# Patient Record
Sex: Female | Born: 1950 | Race: White | Hispanic: No | Marital: Married | Smoking: Never smoker
Health system: Southern US, Community
[De-identification: ages and names within clinical notes are randomized; demographics above are authoritative.]

## PROBLEM LIST (undated history)

## (undated) DIAGNOSIS — T4145XA Adverse effect of unspecified anesthetic, initial encounter: Secondary | ICD-10-CM

## (undated) DIAGNOSIS — R519 Headache, unspecified: Secondary | ICD-10-CM

## (undated) DIAGNOSIS — I341 Nonrheumatic mitral (valve) prolapse: Secondary | ICD-10-CM

## (undated) DIAGNOSIS — R51 Headache: Secondary | ICD-10-CM

## (undated) DIAGNOSIS — T8859XA Other complications of anesthesia, initial encounter: Secondary | ICD-10-CM

## (undated) DIAGNOSIS — M1990 Unspecified osteoarthritis, unspecified site: Secondary | ICD-10-CM

## (undated) DIAGNOSIS — Z98811 Dental restoration status: Secondary | ICD-10-CM

## (undated) HISTORY — PX: APPENDECTOMY: SHX54

## (undated) HISTORY — PX: KNEE SURGERY: SHX244

---

## 2001-02-25 ENCOUNTER — Other Ambulatory Visit: Admission: RE | Admit: 2001-02-25 | Discharge: 2001-02-25 | Payer: Self-pay | Admitting: Family Medicine

## 2003-08-24 LAB — HM COLONOSCOPY: HM Colonoscopy: NORMAL

## 2004-06-28 DIAGNOSIS — E7800 Pure hypercholesterolemia, unspecified: Secondary | ICD-10-CM | POA: Insufficient documentation

## 2004-07-16 ENCOUNTER — Ambulatory Visit: Payer: Self-pay

## 2004-07-19 ENCOUNTER — Ambulatory Visit: Payer: Self-pay

## 2004-08-22 ENCOUNTER — Ambulatory Visit: Payer: Self-pay | Admitting: Specialist

## 2005-01-27 ENCOUNTER — Ambulatory Visit: Payer: Self-pay

## 2005-10-27 ENCOUNTER — Ambulatory Visit: Payer: Self-pay

## 2005-11-10 ENCOUNTER — Ambulatory Visit: Payer: Self-pay | Admitting: Family Medicine

## 2006-11-12 ENCOUNTER — Ambulatory Visit: Payer: Self-pay | Admitting: Family Medicine

## 2007-03-17 ENCOUNTER — Ambulatory Visit: Payer: Self-pay

## 2007-03-30 ENCOUNTER — Ambulatory Visit: Payer: Self-pay | Admitting: Family Medicine

## 2007-10-05 DIAGNOSIS — M899 Disorder of bone, unspecified: Secondary | ICD-10-CM | POA: Insufficient documentation

## 2007-11-18 ENCOUNTER — Ambulatory Visit: Payer: Self-pay

## 2008-11-20 ENCOUNTER — Ambulatory Visit: Payer: Self-pay

## 2009-09-10 ENCOUNTER — Ambulatory Visit: Payer: Self-pay | Admitting: Family Medicine

## 2009-12-14 ENCOUNTER — Ambulatory Visit: Payer: Self-pay

## 2011-01-14 LAB — HM PAP SMEAR: HM Pap smear: NEGATIVE

## 2011-01-15 ENCOUNTER — Ambulatory Visit: Payer: Self-pay | Admitting: Family Medicine

## 2012-02-10 ENCOUNTER — Ambulatory Visit: Payer: Self-pay | Admitting: Family Medicine

## 2013-03-01 ENCOUNTER — Ambulatory Visit: Payer: Self-pay | Admitting: Family Medicine

## 2013-03-09 ENCOUNTER — Ambulatory Visit: Payer: Self-pay | Admitting: Family Medicine

## 2014-11-02 LAB — LIPID PANEL
Cholesterol: 233 mg/dL — AB (ref 0–200)
HDL: 65 mg/dL (ref 35–70)
LDL Cholesterol: 151 mg/dL
TRIGLYCERIDES: 83 mg/dL (ref 40–160)

## 2014-11-02 LAB — HEPATIC FUNCTION PANEL
ALT: 24 U/L (ref 7–35)
AST: 23 U/L (ref 13–35)

## 2014-11-02 LAB — CBC AND DIFFERENTIAL
HEMATOCRIT: 39 % (ref 36–46)
Hemoglobin: 12.8 g/dL (ref 12.0–16.0)
PLATELETS: 281 10*3/uL (ref 150–399)
WBC: 5.2 10^3/mL

## 2014-11-02 LAB — BASIC METABOLIC PANEL
BUN: 13 mg/dL (ref 4–21)
CREATININE: 0.6 mg/dL (ref 0.5–1.1)
Glucose: 103 mg/dL
Potassium: 4.5 mmol/L (ref 3.4–5.3)
SODIUM: 140 mmol/L (ref 137–147)

## 2014-11-02 LAB — TSH: TSH: 2.49 u[IU]/mL (ref 0.41–5.90)

## 2014-11-10 ENCOUNTER — Ambulatory Visit
Admit: 2014-11-10 | Disposition: A | Discharge status: Home / Self Care | Payer: Self-pay | Attending: Family Medicine | Admitting: Family Medicine

## 2014-11-10 LAB — HM MAMMOGRAPHY

## 2015-01-16 ENCOUNTER — Telehealth: Payer: Self-pay

## 2015-01-16 NOTE — Telephone Encounter (Signed)
Mailed letter to pt to call and schedule colonoscopy.

## 2015-01-16 NOTE — Telephone Encounter (Signed)
-----   Message from Otilio Jefferson sent at 01/15/2015 10:51 AM EDT ----- Regarding: Re: Patient requesting call from nursing services Sent: 01/03/2015  no answer and there was no vm to leave a message ..md  > From: Carlota Raspberry > To: Colonoscopy, Triage > Sent: 01/02/2015 9:46 AM > COLON TRIAGE

## 2015-01-16 NOTE — Telephone Encounter (Signed)
Tried contacting pt. No vm to leave message.

## 2015-03-27 ENCOUNTER — Other Ambulatory Visit: Payer: Self-pay

## 2015-03-27 ENCOUNTER — Telehealth: Payer: Self-pay | Admitting: Gastroenterology

## 2015-03-27 NOTE — Telephone Encounter (Signed)
Gastroenterology Pre-Procedure Review  Request Date: 04-27-2015 Requesting Physician: Dr. Venia Minks   PATIENT REVIEW QUESTIONS: The patient responded to the following health history questions as indicated:    1. Are you having any GI issues? no 2. Do you have a personal history of Polyps? no 3. Do you have a family history of Colon Cancer or Polyps? no 4. Diabetes Mellitus? no 5. Joint replacements in the past 12 months?no 6. Major health problems in the past 3 months?no 7. Any artificial heart valves, MVP, or defibrillator?no    MEDICATIONS & ALLERGIES:    Patient reports the following regarding taking any anticoagulation/antiplatelet therapy:   Plavix, Coumadin, Eliquis, Xarelto, Lovenox, Pradaxa, Brilinta, or Effient? no Aspirin? no  Patient confirms/reports the following medications:  N/A No current outpatient prescriptions on file.   No current facility-administered medications for this visit.    Patient confirms/reports the following allergies: Codeine, makes her very sick  Allergies not on file  No orders of the defined types were placed in this encounter.    AUTHORIZATION INFORMATION Primary Insurance: 1D#: Group #:  Secondary Insurance: 1D#: Group #:  SCHEDULE INFORMATION: Date:04-27-2015  Time: [id number]

## 2015-03-27 NOTE — Telephone Encounter (Signed)
Order sent to [facility] and scheduled for 04-27-15.

## 2015-04-23 ENCOUNTER — Encounter: Payer: Self-pay | Admitting: *Deleted

## 2015-04-24 NOTE — Discharge Instructions (Signed)

## 2015-04-27 ENCOUNTER — Ambulatory Visit
Admission: RE | Admit: 2015-04-27 | Discharge: 2015-04-27 | Disposition: A | Discharge status: Home / Self Care | Payer: 59 | Source: Ambulatory Visit | Attending: Gastroenterology | Admitting: Gastroenterology

## 2015-04-27 ENCOUNTER — Encounter
Admission: RE | Disposition: A | Discharge status: Home / Self Care | Payer: Self-pay | Source: Ambulatory Visit | Attending: Gastroenterology

## 2015-04-27 ENCOUNTER — Ambulatory Visit: Payer: 59 | Admitting: Anesthesiology

## 2015-04-27 ENCOUNTER — Encounter: Payer: Self-pay | Admitting: Gastroenterology

## 2015-04-27 DIAGNOSIS — Z1211 Encounter for screening for malignant neoplasm of colon: Secondary | ICD-10-CM | POA: Diagnosis present

## 2015-04-27 DIAGNOSIS — I341 Nonrheumatic mitral (valve) prolapse: Secondary | ICD-10-CM | POA: Diagnosis not present

## 2015-04-27 DIAGNOSIS — Z7982 Long term (current) use of aspirin: Secondary | ICD-10-CM | POA: Insufficient documentation

## 2015-04-27 DIAGNOSIS — M170 Bilateral primary osteoarthritis of knee: Secondary | ICD-10-CM | POA: Diagnosis not present

## 2015-04-27 DIAGNOSIS — Z79899 Other long term (current) drug therapy: Secondary | ICD-10-CM | POA: Diagnosis not present

## 2015-04-27 DIAGNOSIS — K640 First degree hemorrhoids: Secondary | ICD-10-CM | POA: Diagnosis not present

## 2015-04-27 DIAGNOSIS — M160 Bilateral primary osteoarthritis of hip: Secondary | ICD-10-CM | POA: Insufficient documentation

## 2015-04-27 HISTORY — DX: Unspecified osteoarthritis, unspecified site: M19.90

## 2015-04-27 HISTORY — DX: Other complications of anesthesia, initial encounter: T88.59XA

## 2015-04-27 HISTORY — DX: Dental restoration status: Z98.811

## 2015-04-27 HISTORY — DX: Headache, unspecified: R51.9

## 2015-04-27 HISTORY — DX: Headache: R51

## 2015-04-27 HISTORY — PX: COLONOSCOPY WITH PROPOFOL: SHX5780

## 2015-04-27 HISTORY — DX: Nonrheumatic mitral (valve) prolapse: I34.1

## 2015-04-27 HISTORY — DX: Adverse effect of unspecified anesthetic, initial encounter: T41.45XA

## 2015-04-27 SURGERY — COLONOSCOPY WITH PROPOFOL
Anesthesia: Monitor Anesthesia Care | Wound class: Contaminated

## 2015-04-27 MED ORDER — OXYCODONE HCL 5 MG/5ML PO SOLN: 5.0000 mg | Freq: Once | ORAL | Status: DC | PRN

## 2015-04-27 MED ORDER — STERILE WATER FOR IRRIGATION IR SOLN
Status: DC | PRN
  Administered 2015-04-27: 5 mL

## 2015-04-27 MED ORDER — PROPOFOL 10 MG/ML IV BOLUS
INTRAVENOUS | Status: DC | PRN
  Administered 2015-04-27 (×6): 20 mg via INTRAVENOUS

## 2015-04-27 MED ORDER — LACTATED RINGERS IV SOLN
INTRAVENOUS | Status: DC
  Administered 2015-04-27: 09:00:00 via INTRAVENOUS

## 2015-04-27 MED ORDER — OXYCODONE HCL 5 MG PO TABS: 5.0000 mg | ORAL_TABLET | Freq: Once | ORAL | Status: DC | PRN

## 2015-04-27 MED ORDER — LIDOCAINE HCL (CARDIAC) 20 MG/ML IV SOLN
INTRAVENOUS | Status: DC | PRN
  Administered 2015-04-27: 30 mg via INTRAVENOUS

## 2015-04-27 SURGICAL SUPPLY — 28 items

## 2015-04-27 NOTE — Op Note (Signed)
[facility] Gastroenterology Patient Name: Lauren Sims Procedure Date: 04/27/2015 9:33 AM MRN: [id number] Account #: [id number] Date of Birth: 1950-12-06 Admit Type: Outpatient Age: 64 Room: [facility] OR ROOM 01 Gender: Female Note Status: Finalized Procedure:         Colonoscopy Indications:       Screening for colorectal malignant neoplasm Providers:         Lucilla Lame, MD Referring MD:      Jerrell Belfast, MD (Referring MD) Medicines:         Propofol per Anesthesia Complications:     No immediate complications. Procedure:         Pre-Anesthesia Assessment:                    - Prior to the procedure, a History and Physical was                     performed, and patient medications and allergies were                     reviewed. The patient's tolerance of previous anesthesia                     was also reviewed. The risks and benefits of the procedure                     and the sedation options and risks were discussed with the                     patient. All questions were answered, and informed consent                     was obtained. Prior Anticoagulants: The patient has taken                     no previous anticoagulant or antiplatelet agents. ASA                     Grade Assessment: II - A patient with mild systemic                     disease. After reviewing the risks and benefits, the                     patient was deemed in satisfactory condition to undergo                     the procedure.                    After obtaining informed consent, the colonoscope was                     passed under direct vision. Throughout the procedure, the                     patient's blood pressure, pulse, and oxygen saturations                     were monitored continuously. The was introduced through                     the anus and advanced to the the cecum, identified by  appendiceal orifice and ileocecal valve. The colonoscopy                was performed without difficulty. The patient tolerated                     the procedure well. The quality of the bowel preparation                     was excellent. Findings:      The perianal and digital rectal examinations were normal.      Non-bleeding internal hemorrhoids were found during retroflexion. The       hemorrhoids were Grade I (internal hemorrhoids that do not prolapse). Impression:        - Non-bleeding internal hemorrhoids.                    - No specimens collected. Recommendation:    - Repeat colonoscopy in 10 years for screening unless any                     change in family history or lower GI problems. Procedure Code(s): --- Professional ---                    [phone], Colonoscopy, flexible; diagnostic, including                     collection of specimen(s) by brushing or washing, when                     performed (separate procedure) Diagnosis Code(s): --- Professional ---                    Z12.11, Encounter for screening for malignant neoplasm of                     colon CPT copyright 2014 American Medical Association. All rights reserved. The codes documented in this report are preliminary and upon coder review may  be revised to meet current compliance requirements. Lucilla Lame, MD 04/27/2015 9:50:27 AM This report has been signed electronically. Number of Addenda: 0 Note Initiated On: 04/27/2015 9:33 AM Scope Withdrawal Time: 0 hours 6 minutes 40 seconds  Total Procedure Duration: 0 hours 11 minutes 47 seconds       [facility]

## 2015-04-27 NOTE — Transfer of Care (Signed)
Immediate Anesthesia Transfer of Care Note  Patient: Lauren Sims  Procedure(s) Performed: Procedure(s): COLONOSCOPY WITH PROPOFOL (N/A)  Patient Location: PACU  Anesthesia Type: MAC  Level of Consciousness: awake, alert  and patient cooperative  Airway and Oxygen Therapy: Patient Spontanous Breathing and Patient connected to supplemental oxygen  Post-op Assessment: Post-op Vital signs reviewed, Patient's Cardiovascular Status Stable, Respiratory Function Stable, Patent Airway and No signs of Nausea or vomiting  Post-op Vital Signs: Reviewed and stable  Complications: No apparent anesthesia complications

## 2015-04-27 NOTE — Anesthesia Procedure Notes (Signed)
Procedure Name: MAC Performed by: Nat Christen Pre-anesthesia Checklist: Patient identified, Emergency Drugs available, Suction available, Patient being monitored and Timeout performed Patient Re-evaluated:Patient Re-evaluated prior to inductionOxygen Delivery Method: Nasal cannula

## 2015-04-27 NOTE — H&P (Signed)
  [facility]  [address], [place], [place] [address] Phone: [phone] Fax : [phone]  Primary Care Physician:  Margarita Rana, MD Primary Gastroenterologist:  Dr. Allen Norris  Pre-Procedure History & Physical: HPI:  Lauren Sims is a 64 y.o. female is here for a screening colonoscopy.   Past Medical History  Diagnosis Date  . Complication of anesthesia     slow to wake  . Arthritis     hips/knees  . Headache   . Dental crown present     cap - top, front. crown - top,rear  . Mitral valve prolapse     followed by PCP    Past Surgical History  Procedure Laterality Date  . Knee surgery    . Appendectomy      Prior to Admission medications   Medication Sig Start Date End Date Taking? Authorizing Provider  aspirin 325 MG tablet Take 325 mg by mouth daily as needed.   Yes Historical Provider, MD  Cholecalciferol (VITAMIN D PO) Take by mouth.   Yes Historical Provider, MD  LYSINE PO Take by mouth.   Yes Historical Provider, MD  Multiple Vitamins-Minerals (CENTRUM ADULTS PO) Take by mouth.   Yes Historical Provider, MD    Allergies as of 03/27/2015  . (Not on File)    History reviewed. No pertinent family history.  Social History   Social History  . Marital Status: Married    Spouse Name: N/A  . Number of Children: N/A  . Years of Education: N/A   Occupational History  . Not on file.   Social History Main Topics  . Smoking status: Never Smoker   . Smokeless tobacco: Not on file  . Alcohol Use: Yes     Comment: 1 glass wine every other week  . Drug Use: Not on file  . Sexual Activity: Not on file   Other Topics Concern  . Not on file   Social History Narrative  . No narrative on file    Review of Systems: See HPI, otherwise negative ROS  Physical Exam: BP 136/72 mmHg  Pulse 86  Temp(Src) 97.7 F (36.5 C) (Temporal)  Resp 16  Ht 5\' 6"  (1.676 m)  Wt 171 lb (77.565 kg)  BMI 27.61 kg/m2  SpO2 98% General:   Alert,   pleasant and cooperative in NAD Head:  Normocephalic and atraumatic. Neck:  Supple; no masses or thyromegaly. Lungs:  Clear throughout to auscultation.    Heart:  Regular rate and rhythm. Abdomen:  Soft, nontender and nondistended. Normal bowel sounds, without guarding, and without rebound.   Neurologic:  Alert and  oriented x4;  grossly normal neurologically.  Impression/Plan: Lauren Sims is now here to undergo a screening colonoscopy.  Risks, benefits, and alternatives regarding colonoscopy have been reviewed with the patient.  Questions have been answered.  All parties agreeable.

## 2015-04-27 NOTE — Anesthesia Postprocedure Evaluation (Signed)
  Anesthesia Post-op Note  Patient: Lauren Sims  Procedure(s) Performed: Procedure(s): COLONOSCOPY WITH PROPOFOL (N/A)  Anesthesia type:MAC  Patient location: PACU  Post pain: Pain level controlled  Post assessment: Post-op Vital signs reviewed, Patient's Cardiovascular Status Stable, Respiratory Function Stable, Patent Airway and No signs of Nausea or vomiting  Post vital signs: Reviewed and stable  Last Vitals:  Filed Vitals:   04/27/15 1015  BP:   Pulse:   Temp: 36.3 C  Resp:     Level of consciousness: awake, alert  and patient cooperative  Complications: No apparent anesthesia complications

## 2015-04-27 NOTE — Anesthesia Preprocedure Evaluation (Addendum)
Anesthesia Evaluation  Patient identified by MRN, date of birth, ID band Patient awake    Reviewed: Allergy & Precautions, H&P , Patient's Chart, lab work & pertinent test results  History of Anesthesia Complications (+) history of anesthetic complications (states slow to wake up- no reactions)  Airway Mallampati: II  TM Distance: >3 FB Neck ROM: full    Dental no notable dental hx.    Pulmonary neg pulmonary ROS,    Pulmonary exam normal        Cardiovascular Normal cardiovascular exam     Neuro/Psych    GI/Hepatic Neg liver ROS, Bowel prep,  Endo/Other  negative endocrine ROS  Renal/GU negative Renal ROS     Musculoskeletal   Abdominal   Peds  Hematology negative hematology ROS (+)   Anesthesia Other Findings   Reproductive/Obstetrics                            Anesthesia Physical Anesthesia Plan  ASA: I  Anesthesia Plan: MAC   Post-op Pain Management:    Induction:   Airway Management Planned:   Additional Equipment:   Intra-op Plan:   Post-operative Plan:   Informed Consent: I have reviewed the patients History and Physical, chart, labs and discussed the procedure including the risks, benefits and alternatives for the proposed anesthesia with the patient or authorized representative who has indicated his/her understanding and acceptance.     Plan Discussed with: CRNA  Anesthesia Plan Comments:         Anesthesia Quick Evaluation

## 2015-09-26 ENCOUNTER — Encounter: Payer: Self-pay | Admitting: Family Medicine

## 2015-09-26 ENCOUNTER — Ambulatory Visit (INDEPENDENT_AMBULATORY_CARE_PROVIDER_SITE_OTHER): Payer: 59 | Admitting: Family Medicine

## 2015-09-26 ENCOUNTER — Other Ambulatory Visit: Payer: Self-pay

## 2015-09-26 VITALS — BP 152/90 | HR 88 | Temp 98.2°F | Resp 16 | Ht 66.0 in | Wt 169.0 lb

## 2015-09-26 DIAGNOSIS — F4322 Adjustment disorder with anxiety: Secondary | ICD-10-CM

## 2015-09-26 DIAGNOSIS — F4320 Adjustment disorder, unspecified: Secondary | ICD-10-CM | POA: Insufficient documentation

## 2015-09-26 DIAGNOSIS — J302 Other seasonal allergic rhinitis: Secondary | ICD-10-CM | POA: Insufficient documentation

## 2015-09-26 MED ORDER — ESCITALOPRAM OXALATE 10 MG PO TABS: 10.0000 mg | ORAL_TABLET | Freq: Every day | ORAL | Status: DC

## 2015-09-26 NOTE — Progress Notes (Signed)
Subjective:    Patient ID: Lauren Sims, female    DOB: 1951/07/24, 65 y.o.   MRN: [id number]  Anxiety Presents for initial visit. Onset was 1 to 6 months ago (since December). The problem has been gradually worsening. Symptoms include chest pain, confusion ("fog"), decreased concentration, depressed mood, dry mouth, excessive worry, insomnia, irritability, malaise, muscle tension, nervous/anxious behavior, palpitations and restlessness. Patient reports no compulsions, dizziness, feeling of choking, hyperventilation, nausea, obsessions, panic, shortness of breath or suicidal ideas. The severity of symptoms is moderate. The symptoms are aggravated by work stress. The patient sleeps 4 hours per night. The quality of sleep is poor.    Insomnia Primary symptoms: difficulty falling asleep, frequent awakening, malaise/fatigue.  The current episode started more than one month (x 3 months). The onset quality is gradual. The problem occurs nightly. The problem has been gradually worsening since onset. The symptoms are aggravated by work stress. How many beverages per day that contain caffeine: 0 - 1.  Types of beverages you drink: soda. Past treatments include medication (Tylenol PM, Essential Oils). The treatment provided moderate relief. Typical bedtime:  10-11 P.M..  How long after going to bed to you fall asleep: 30-60 minutes.   PMH includes: work related stressors.   Has turned in her resignation. Thinks this will help. Can not shut her brain off.  Zones out in the day.     Review of Systems  Constitutional: Positive for malaise/fatigue and irritability.  Respiratory: Negative for shortness of breath.   Cardiovascular: Positive for chest pain and palpitations.  Gastrointestinal: Negative for nausea.  Neurological: Negative for dizziness.  Psychiatric/Behavioral: Positive for confusion ("fog") and decreased concentration. Negative for suicidal ideas. The patient is nervous/anxious and has  insomnia.    BP 152/90 mmHg  Pulse 88  Temp(Src) 98.2 F (36.8 C) (Oral)  Resp 16  Ht 5\' 6"  (1.676 m)  Wt 169 lb (76.658 kg)  BMI 27.29 kg/m2   Patient Active Problem List   Diagnosis Date Noted  . Adaptation reaction 09/26/2015  . Allergic rhinitis, seasonal 09/26/2015  . Special screening for malignant neoplasms, colon   . Headache, migraine 11/21/2009  . Avitaminosis D 10/17/2008  . Bone/cartilage disorder 10/05/2007  . Bloodgood disease 10/05/2007  . Pure hypercholesterolemia 06/28/2004  . Cyst of ovary 03/08/2002  . Cardiac murmur 01/24/1997   Past Medical History  Diagnosis Date  . Complication of anesthesia     slow to wake  . Arthritis     hips/knees  . Headache   . Dental crown present     cap - top, front. crown - top,rear  . Mitral valve prolapse     followed by PCP   Current Outpatient Prescriptions on File Prior to Visit  Medication Sig  . aspirin 325 MG tablet Take 325 mg by mouth daily as needed.  . Cholecalciferol (VITAMIN D PO) Take by mouth.  . LYSINE PO Take by mouth.   No current facility-administered medications on file prior to visit.   Allergies  Allergen Reactions  . Codeine Nausea And Vomiting  . Shellfish Allergy Rash   Past Surgical History  Procedure Laterality Date  . Knee surgery    . Appendectomy    . Colonoscopy with propofol N/A 04/27/2015    Procedure: COLONOSCOPY WITH PROPOFOL;  Surgeon: Lucilla Lame, MD;  Location: [facility];  Service: Endoscopy;  Laterality: N/A;   Social History   Social History  . Marital Status: Married    Spouse Name:  N/A  . Number of Children: N/A  . Years of Education: N/A   Occupational History  . Not on file.   Social History Main Topics  . Smoking status: Never Smoker   . Smokeless tobacco: Not on file  . Alcohol Use: Yes     Comment: 1 glass wine every other week  . Drug Use: Not on file  . Sexual Activity: Not on file   Other Topics Concern  . Not on file   Social  History Narrative   No family history on file.     Objective:   Physical Exam  Constitutional: She is oriented to person, place, and time. She appears well-developed and well-nourished.  Neurological: She is alert and oriented to person, place, and time.  Tearful.   Psychiatric: She has a normal mood and affect. Her behavior is normal. Judgment and thought content normal.   BP 152/90 mmHg  Pulse 88  Temp(Src) 98.2 F (36.8 C) (Oral)  Resp 16  Ht 5\' 6"  (1.676 m)  Wt 169 lb (76.658 kg)  BMI 27.29 kg/m2      Assessment & Plan:  1. Adjustment disorder with anxiety New problem. Worsening Will treat.  Recheck in 3 weeks. Patient instructed to call back if condition worsens or does not improve.    - escitalopram (LEXAPRO) 10 MG tablet; Take 1 tablet (10 mg total) by mouth daily. 1/2 a day for one week and then one a day.  Dispense: 30 tablet; Refill: 0   Patient was seen and examined by Jerrell Belfast, MD, and note scribed by Renaldo Fiddler, CMA. I have reviewed the document for accuracy and completeness and I agree with above. Jerrell Belfast, MD   Margarita Rana, MD

## 2015-09-27 ENCOUNTER — Telehealth: Payer: Self-pay | Admitting: Family Medicine

## 2015-09-27 NOTE — Telephone Encounter (Signed)
Pt husband, Chrissie Noa called stating yesterday pt rec'd a Rx for anxiety and last night she started having diarrhea. Pt was up al night with this.   Pt is requesting a different Rx.  [facility].  CB#[id number]/MW

## 2015-09-27 NOTE — Telephone Encounter (Signed)
Unclear if medication or stomach bug. Recommend stop until feels better and retries before giving up on the medication. Thanks.

## 2015-09-27 NOTE — Telephone Encounter (Signed)
Pt's husband called back about the message below. I advised that on Tuesday and Thursdays Dr. Venia Minks comes in later in the morning. He requested that since he has to leave the house for a nurse to return his call on his cell phone. Thanks TNP

## 2015-10-17 ENCOUNTER — Ambulatory Visit (INDEPENDENT_AMBULATORY_CARE_PROVIDER_SITE_OTHER): Payer: 59 | Admitting: Family Medicine

## 2015-10-17 ENCOUNTER — Encounter: Payer: Self-pay | Admitting: Family Medicine

## 2015-10-17 VITALS — BP 124/72 | HR 64 | Temp 98.2°F | Resp 16 | Wt 168.0 lb

## 2015-10-17 DIAGNOSIS — F4322 Adjustment disorder with anxiety: Secondary | ICD-10-CM

## 2015-10-17 MED ORDER — ESCITALOPRAM OXALATE 10 MG PO TABS: 10.0000 mg | ORAL_TABLET | Freq: Every day | ORAL | Status: DC

## 2015-10-17 NOTE — Progress Notes (Signed)
Patient ID: Lauren Sims, female   DOB: 06-23-1951, 65 y.o.   MRN: [id number]       Patient: Lauren Sims Female    DOB: 1950-10-27   65 y.o.   MRN: [id number] Visit Date: 10/17/2015  Today's Provider: Margarita Rana, MD   Chief Complaint  Patient presents with  . Adjustment Disorder   Subjective:    HPI  Follow up for anxiety disorder with anxiety  The patient was last seen for this 3 weeks ago. Changes made at last visit include starting Lexapro.  She reports excellent compliance with treatment. She feels that condition is Improved. She is not having side effects.  Patient denies decreased concentration and reports that she is sleeping better. Patient reports that she will retire next week and feels that it will help with her symptoms. Patient reports that she takes 1/2 tablet BID. ------------------------------------------------------------------------------------     Allergies  Allergen Reactions  . Codeine Nausea And Vomiting  . Shellfish Allergy Rash   Previous Medications   ASPIRIN 325 MG TABLET    Take 325 mg by mouth daily as needed.   BUTALBITAL-ACETAMINOPHEN-CAFFEINE (FIORICET, ESGIC) 50-325-40 MG TABLET    Take by mouth as needed.    CHOLECALCIFEROL (VITAMIN D PO)    Take 1 capsule by mouth daily.    ESCITALOPRAM (LEXAPRO) 10 MG TABLET    Take 1 tablet (10 mg total) by mouth daily. 1/2 a day for one week and then one a day.   LYSINE PO    Take 1 capsule by mouth every other day.    OMEGA-3 FATTY ACIDS (FISH OIL) 1000 MG CAPS    Take 1 capsule by mouth daily.     Review of Systems  Constitutional: Negative.   Cardiovascular: Negative.   Neurological: Negative.     Social History  Substance Use Topics  . Smoking status: Never Smoker   . Smokeless tobacco: Never Used  . Alcohol Use: Yes     Comment: 1 glass wine every other week   Objective:   BP 124/72 mmHg  Pulse 64  Temp(Src) 98.2 F (36.8 C) (Oral)  Resp 16  Wt 168 lb (76.204  kg)  Physical Exam  Constitutional: She appears well-developed and well-nourished.  Psychiatric: She has a normal mood and affect. Her behavior is normal. Judgment and thought content normal.        Assessment & Plan:     1. Adjustment disorder with anxiety Improved. Patient advised to continue escitalopram 10 mg at bedtime. - escitalopram (LEXAPRO) 10 MG tablet; Take 1 tablet (10 mg total) by mouth daily. 1/2 a day for one week and then one a day.  Dispense: 30 tablet; Refill: 5    Patient seen and examined by Dr. Jerrell Belfast, and note scribed by Philbert Riser. Dimas, CMA.  I have reviewed the document for accuracy and completeness and I agree with above. - Jerrell Belfast, MD   Margarita Rana, MD  [facility]

## 2015-12-11 ENCOUNTER — Encounter: Payer: Self-pay | Admitting: Emergency Medicine

## 2015-12-11 ENCOUNTER — Emergency Department
Admission: EM | Admit: 2015-12-11 | Discharge: 2015-12-11 | Disposition: A | Discharge status: Home / Self Care | Payer: 59 | Attending: Emergency Medicine | Admitting: Emergency Medicine

## 2015-12-11 DIAGNOSIS — Z79899 Other long term (current) drug therapy: Secondary | ICD-10-CM | POA: Insufficient documentation

## 2015-12-11 DIAGNOSIS — Z91013 Allergy to seafood: Secondary | ICD-10-CM | POA: Diagnosis not present

## 2015-12-11 DIAGNOSIS — R040 Epistaxis: Secondary | ICD-10-CM | POA: Insufficient documentation

## 2015-12-11 DIAGNOSIS — M1990 Unspecified osteoarthritis, unspecified site: Secondary | ICD-10-CM | POA: Diagnosis not present

## 2015-12-11 MED ORDER — ACETAMINOPHEN 325 MG PO TABS
650.0000 mg | ORAL_TABLET | Freq: Once | ORAL | Status: AC
  Administered 2015-12-11: 650 mg via ORAL

## 2015-12-11 MED ORDER — TRANEXAMIC ACID 1000 MG/10ML IV SOLN
500.0000 mg | Freq: Once | INTRAVENOUS | Status: AC
  Administered 2015-12-11: 500 mg via TOPICAL
  Filled 2015-12-11: qty 10

## 2015-12-11 MED ORDER — ACETAMINOPHEN 325 MG PO TABS
ORAL_TABLET | ORAL | Status: AC
  Administered 2015-12-11: 650 mg via ORAL
  Filled 2015-12-11: qty 2

## 2015-12-11 NOTE — ED Notes (Signed)
Pt to ed with c/o nosebleed today.  Pt states similar episode several weeks ago,  And then about 3 days ago another episode but both times pt was able to get it stopped quickly,  Today reports bleeding continued for 1 hour.  Pt was seen on scene by medics and given nose spray and nasal clamp.   At this time, bleeding is controlled.

## 2015-12-11 NOTE — Discharge Instructions (Signed)
As we discussed, there are several techniques you can use to prevent or stop nosebleeds in the future.  Keep your nose moist either with saline spray several times a day or by applying a thin layer of Neosporin, bacitracin, or other antibiotic ointment to the inside of your nose once or twice a day.  If the bleeding starts up again, gently blow your nose into a tissue to clear the blood and clots, then apply 1-2 sprays to each affected nostril of over-the-counter Afrin nasal spray (oxymetazoline).   Then squeeze your nose shut tightly and DO NOT PEEK for at least 15 minutes.  This will resolve most nosebleeds.  If you continue to have trouble after trying these techniques, or anything seems out of the ordinary or concerns you, please return tot he Emergency Department.   Nosebleed Nosebleeds are common. They are due to a crack in the inside lining of your nose (mucous membrane) or from a small blood vessel that starts to bleed. Nosebleeds can be caused by many conditions, such as injury, infections, dry mucous membranes or dry climate, medicines, nose picking, and home heating and cooling systems. Most nosebleeds come from blood vessels in the front of your nose. HOME CARE INSTRUCTIONS   Try controlling your nosebleed by pinching your nostrils gently and continuously for at least 10 minutes.  Avoid blowing or sniffing your nose for a number of hours after having a nosebleed.  Do not put gauze inside your nose yourself. If your nose was packed by your health care provider, try to maintain the pack inside of your nose until your health care provider removes it.  If a gauze pack was used and it starts to fall out, gently replace it or cut off the end of it.  If a balloon catheter was used to pack your nose, do not cut or remove it unless your health care provider has instructed you to do that.  Avoid lying down while you are having a nosebleed. Sit up and lean forward.  Use a nasal spray  decongestant to help with a nosebleed as directed by your health care provider.  Do not use petroleum jelly or mineral oil in your nose. These can drip into your lungs.  Maintain humidity in your home by using less air conditioning or by using a humidifier.  Aspirinand blood thinners make bleeding more likely. If you are prescribed these medicines and you suffer from nosebleeds, ask your health care provider if you should stop taking the medicines or adjust the dose. Do not stop medicines unless directed by your health care provider  Resume your normal activities as you are able, but avoid straining, lifting, or bending at the waist for several days.  If your nosebleed was caused by dry mucous membranes, use over-the-counter saline nasal spray or gel. This will keep the mucous membranes moist and allow them to heal. If you must use a lubricant, choose the water-soluble variety. Use it only sparingly, and do not use it within several hours of lying down.  Keep all follow-up visits as directed by your health care provider. This is important. SEEK MEDICAL CARE IF:  You have a fever.  You get frequent nosebleeds.  You are getting nosebleeds more often. SEEK IMMEDIATE MEDICAL CARE IF:  Your nosebleed lasts longer than 20 minutes.  Your nosebleed occurs after an injury to your face, and your nose looks crooked or broken.  You have unusual bleeding from other parts of your body.  You have  unusual bruising on other parts of your body.  You feel light-headed or you faint.  You become sweaty.  You vomit blood.  Your nosebleed occurs after a head injury.   This information is not intended to replace advice given to you by your health care provider. Make sure you discuss any questions you have with your health care provider.   Document Released: 04/23/2005 Document Revised: 08/04/2014 Document Reviewed: 02/27/2014 Elsevier Interactive Patient Education [employer].

## 2015-12-11 NOTE — ED Provider Notes (Signed)
[facility] Emergency Department Provider Note  ____________________________________________  Time seen: Approximately 7:02 PM  I have reviewed the triage vital signs and the nursing notes.   HISTORY  Chief Complaint Epistaxis    HPI Lauren Sims is a 65 y.o. female who is otherwise healthy and who reports that she currently takes no medications including no blood thinnerspresents with acute onset right sided nosebleed.  She has had several nosebleeds over the last couple of weeks.  She has not had any trauma.  The [place] one [place].  3 days ago she had an episode but she was able to stop it with direct pressure.  Today the bleeding continued briskly for about an hour prior to paramedics being called to the store she was at and giving her some Afrin and a nose clamp.  She denies pain but states that the bleeding was moderate to severe.  She has not had any lightheadedness nor dizziness.  She denies chest pain and shortness of breath as well as abdominal pain, nausea, vomiting, diarrhea.  She has a mild to moderate headache.  She has had nosebleeds in the past but has never been to an ENT.   Past Medical History  Diagnosis Date  . Complication of anesthesia     slow to wake  . Arthritis     hips/knees  . Headache   . Dental crown present     cap - top, front. crown - top,rear  . Mitral valve prolapse     followed by PCP    Patient Active Problem List   Diagnosis Date Noted  . Adaptation reaction 09/26/2015  . Allergic rhinitis, seasonal 09/26/2015  . Adjustment disorder with anxiety 09/26/2015  . Special screening for malignant neoplasms, colon   . Headache, migraine 11/21/2009  . Avitaminosis D 10/17/2008  . Bone/cartilage disorder 10/05/2007  . Bloodgood disease 10/05/2007  . Pure hypercholesterolemia 06/28/2004  . Cyst of ovary 03/08/2002  . Cardiac murmur 01/24/1997      Past Surgical History  Procedure Laterality Date  . Knee surgery    . Appendectomy    . Colonoscopy with propofol N/A 04/27/2015    Procedure: COLONOSCOPY WITH PROPOFOL;  Surgeon: Lucilla Lame, MD;  Location: [place];  Service: Endoscopy;  Laterality: N/A;    Current Outpatient Rx  Name  Route  Sig  Dispense  Refill  . aspirin 325 MG tablet   Oral   Take 325 mg by mouth daily as needed.         . butalbital-acetaminophen-caffeine (FIORICET, ESGIC) 50-325-40 MG tablet   Oral   Take by mouth as needed.          . Cholecalciferol (VITAMIN D PO)   Oral   Take 1 capsule by mouth daily.          Marland Kitchen escitalopram (LEXAPRO) 10 MG tablet   Oral   Take 1 tablet (10 mg total) by mouth daily. 1/2 a day for one week and then one a day.   30 tablet   5   . LYSINE PO   Oral   Take 1 capsule by mouth every other day.          . Omega-3 Fatty Acids (FISH OIL) 1000 MG CAPS   Oral   Take 1 capsule by mouth daily.            Allergies Codeine and  Shellfish allergy  History reviewed. No pertinent family history.  Social History Social History  Substance Use Topics  . Smoking status: Never Smoker   . Smokeless tobacco: Never Used  . Alcohol Use: Yes     Comment: 1 glass wine every other week    Review of Systems Constitutional: No fever/chills Eyes: No visual changes. ENT: +right sided brisk epistaxis (spontaneous) Cardiovascular: Denies chest pain. Respiratory: Denies shortness of breath. Gastrointestinal: No abdominal pain.  No nausea, no vomiting.  No diarrhea.  No constipation. Genitourinary: Negative for dysuria. Musculoskeletal: Negative for back pain. Skin: Negative for rash. Neurological: Negative for headaches, focal weakness or numbness.  10-point ROS otherwise negative.  ____________________________________________   PHYSICAL EXAM:  VITAL SIGNS: ED Triage Vitals  Enc Vitals Group     BP 12/11/15 1637 161/85 mmHg     Pulse Rate  12/11/15 1637 83     Resp 12/11/15 1637 18     Temp 12/11/15 1637 97.5 F (36.4 C)     Temp Source 12/11/15 1637 Oral     SpO2 12/11/15 1637 97 %     Weight 12/11/15 1637 165 lb (74.844 kg)     Height 12/11/15 1637 5\' 6"  (1.676 m)     Head Cir --      Peak Flow --      Pain Score 12/11/15 1637 6     Pain Loc --      Pain Edu? --      Excl. in [place]? --     Constitutional: Alert and oriented. Well appearing and in no acute distress. Eyes: Conjunctivae are normal. PERRL. EOMI. Head: Atraumatic. Nose: Left nare normal.  Right side with slow oozing blood, no large clots, no septal hematoma, no obvious source to cauterize Mouth/Throat: Mucous membranes are moist.  Oropharynx non-erythematous. Neck: No stridor.  No meningeal signs.   Cardiovascular: Normal rate, regular rhythm. Good peripheral circulation. Grossly normal heart sounds.   Respiratory: Normal respiratory effort.  No retractions. Lungs CTAB. Gastrointestinal: Soft and nontender. No distention.  Musculoskeletal: No lower extremity tenderness nor edema. No gross deformities of extremities. Neurologic:  Normal speech and language. No gross focal neurologic deficits are appreciated.  Skin:  Skin is warm, dry and intact. No rash noted. Psychiatric: Mood and affect are normal. Speech and behavior are normal.  ____________________________________________   LABS (all labs ordered are listed, but only abnormal results are displayed)  Labs Reviewed - No data to display ____________________________________________  EKG  None ____________________________________________  RADIOLOGY   No results found.  ____________________________________________   PROCEDURES  Procedure(s) performed: epistaxis management, see procedure note(s).  Epistaxis Management Date/Time: 12/11/2015 at 7:37 PM Performed by: Hinda Kehr Authorized by: Hinda Kehr Consent: Verbal consent obtained. Written consent not obtained. Risks and  benefits: risks, benefits and alternatives were discussed Consent given by: patient Required items: required blood products, implants, devices, and special equipment available Patient sedated: no Repair method: rolled up 2x2 gauze soaked in tranexamic acid (about 5 mL / 500 mg) inserted into affected naris/nares Post-procedure assessment: bleeding stopped after 15 minutes with gauze in place Treatment complexity: simple Patient tolerance: Patient tolerated the procedure well with no immediate complications    Critical Care performed: No ____________________________________________   INITIAL IMPRESSION / ASSESSMENT AND PLAN / ED COURSE  Pertinent labs & imaging results that were available during my care of the patient were reviewed by me and considered in my medical decision making (see chart for details).  TXA successfully stopped the  epistaxis.  Gave patient my usual and customary discussion including return precautions.  Advised close f/u w/ ENT.  She understands and agrees.   ____________________________________________  FINAL CLINICAL IMPRESSION(S) / ED DIAGNOSES  Final diagnoses:  Right-sided epistaxis     MEDICATIONS GIVEN DURING THIS VISIT:  Medications  tranexamic acid (CYKLOKAPRON) injection 500 mg (500 mg Topical Given 12/11/15 1911)  acetaminophen (TYLENOL) tablet 650 mg (650 mg Oral Given 12/11/15 1939)     NEW OUTPATIENT MEDICATIONS STARTED DURING THIS VISIT:  Discharge Medication List as of 12/11/2015  7:39 PM        Note:  This document was prepared using Dragon voice recognition software and may include unintentional dictation errors.   Hinda Kehr, MD 12/11/15 [phone]

## 2015-12-11 NOTE — ED Notes (Signed)
MD at bedside. 

## 2015-12-17 ENCOUNTER — Encounter: Payer: Self-pay | Admitting: Physician Assistant

## 2015-12-17 ENCOUNTER — Ambulatory Visit (INDEPENDENT_AMBULATORY_CARE_PROVIDER_SITE_OTHER): Payer: 59 | Admitting: Physician Assistant

## 2015-12-17 VITALS — BP 116/70 | HR 73 | Temp 97.8°F | Resp 16 | Ht 66.0 in | Wt 163.6 lb

## 2015-12-17 DIAGNOSIS — R946 Abnormal results of thyroid function studies: Secondary | ICD-10-CM

## 2015-12-17 DIAGNOSIS — Z780 Asymptomatic menopausal state: Secondary | ICD-10-CM | POA: Diagnosis not present

## 2015-12-17 DIAGNOSIS — R7989 Other specified abnormal findings of blood chemistry: Secondary | ICD-10-CM

## 2015-12-17 DIAGNOSIS — Z124 Encounter for screening for malignant neoplasm of cervix: Secondary | ICD-10-CM | POA: Diagnosis not present

## 2015-12-17 DIAGNOSIS — Z1239 Encounter for other screening for malignant neoplasm of breast: Secondary | ICD-10-CM | POA: Diagnosis not present

## 2015-12-17 DIAGNOSIS — Z0000 Encounter for general adult medical examination without abnormal findings: Secondary | ICD-10-CM

## 2015-12-17 DIAGNOSIS — Z13220 Encounter for screening for lipoid disorders: Secondary | ICD-10-CM

## 2015-12-17 DIAGNOSIS — Z136 Encounter for screening for cardiovascular disorders: Secondary | ICD-10-CM

## 2015-12-17 DIAGNOSIS — E7800 Pure hypercholesterolemia, unspecified: Secondary | ICD-10-CM

## 2015-12-17 NOTE — Progress Notes (Signed)
Patient: Lauren Sims, Female    DOB: May 19, 1951, 65 y.o.   MRN: [id number] Visit Date: 12/17/2015  Today's Provider: Mar Daring, PA-C   Chief Complaint  Patient presents with  . Annual Exam   Subjective:    Annual physical exam Lauren Sims is a 65 y.o. female who presents today for health maintenance and complete physical. She feels well.  She reports she has being having episodes of nosebleeds. Most recent nosebleed was 05/16. She was at [employer] and was not able to stop the bleeding, the personel from [employer] called 911 and the ambulance took patient to [facility]. She now has a nose clamp which she carries with her at all times as well as Afrin. She has an appointment scheduled for tomorrow with the ENT in [place] at 1:30. She reports exercising some, she walks. She reports she is sleeping well.  Mammogram: 11/10/2014-Negative Colonoscopy:04/27/2015-Normal; repeat in 10 years BMD: 11/21/2009; h/o osteoporosis; went on a bisphosphonate for many years (she can't remember how long) and last BMD was normal. Pap: due now; no history of abnormals.  -----------------------------------------------------------------   Review of Systems  Constitutional: Negative.   HENT: Positive for nosebleeds.   Eyes: Negative.   Respiratory: Negative.   Cardiovascular: Negative.   Gastrointestinal: Negative.   Endocrine: Negative.   Genitourinary: Negative.   Musculoskeletal: Negative.   Skin: Negative.   Allergic/Immunologic: Negative.   Hematological: Negative.   Psychiatric/Behavioral: Negative.     Social History      She  reports that she has never smoked. She has never used smokeless tobacco. She reports that she drinks alcohol. She reports that she does not use illicit drugs.       Social History   Social History  . Marital Status: Married    Spouse Name: N/A  . Number of Children: N/A  . Years of Education: N/A   Social History Main Topics  . Smoking  status: Never Smoker   . Smokeless tobacco: Never Used  . Alcohol Use: Yes     Comment: 1 glass wine every other week  . Drug Use: No  . Sexual Activity: Not Asked   Other Topics Concern  . None   Social History Narrative    Past Medical History  Diagnosis Date  . Complication of anesthesia     slow to wake  . Arthritis     hips/knees  . Headache   . Dental crown present     cap - top, front. crown - top,rear  . Mitral valve prolapse     followed by PCP     Patient Active Problem List   Diagnosis Date Noted  . Adaptation reaction 09/26/2015  . Allergic rhinitis, seasonal 09/26/2015  . Adjustment disorder with anxiety 09/26/2015  . Special screening for malignant neoplasms, colon   . Headache, migraine 11/21/2009  . Avitaminosis D 10/17/2008  . Bone/cartilage disorder 10/05/2007  . Bloodgood disease 10/05/2007  . Pure hypercholesterolemia 06/28/2004  . Cyst of ovary 03/08/2002  . Cardiac murmur 01/24/1997    Past Surgical History  Procedure Laterality Date  . Knee surgery    . Appendectomy    . Colonoscopy with propofol N/A 04/27/2015    Procedure: COLONOSCOPY WITH PROPOFOL;  Surgeon: Lucilla Lame, MD;  Location: [place];  Service: Endoscopy;  Laterality: N/A;    Family History        No family status information on file.  Her family history is not on file.    Allergies  Allergen Reactions  . Codeine Nausea And Vomiting  . Shellfish Allergy Rash    Previous Medications   ASPIRIN 325 MG TABLET    Take 325 mg by mouth daily as needed.   BUTALBITAL-ACETAMINOPHEN-CAFFEINE (FIORICET, ESGIC) 50-325-40 MG TABLET    Take by mouth as needed.    CHOLECALCIFEROL (VITAMIN D PO)    Take 1 capsule by mouth daily.    ESCITALOPRAM (LEXAPRO) 10 MG TABLET    Take 1 tablet (10 mg total) by mouth daily. 1/2 a day for one week and then one a day.   LYSINE PO    Take 1 capsule by mouth every other day.    OMEGA-3 FATTY ACIDS (FISH OIL) 1000 MG CAPS    Take  1 capsule by mouth daily.     Patient Care Team: Margarita Rana, MD as PCP - General (Family Medicine)     Objective:   Vitals: BP 116/70 mmHg  Pulse 73  Temp(Src) 97.8 F (36.6 C) (Oral)  Resp 16  Ht 5\' 6"  (1.676 m)  Wt 163 lb 9.6 oz (74.208 kg)  BMI 26.42 kg/m2   Physical Exam  Constitutional: She is oriented to person, place, and time. She appears well-developed and well-nourished. No distress.  HENT:  Head: Normocephalic and atraumatic.  Right Ear: Hearing, tympanic membrane, external ear and ear canal normal.  Left Ear: Hearing, tympanic membrane, external ear and ear canal normal.  Nose: Nose normal.  Mouth/Throat: Uvula is midline, oropharynx is clear and moist and mucous membranes are normal. No oropharyngeal exudate.  Eyes: Conjunctivae and EOM are normal. Pupils are equal, round, and reactive to light. Right eye exhibits no discharge. Left eye exhibits no discharge. No scleral icterus.  Neck: Normal range of motion. Neck supple. No JVD present. Carotid bruit is not present. No tracheal deviation present. No thyromegaly present.  Cardiovascular: Normal rate, regular rhythm, normal heart sounds and intact distal pulses.  Exam reveals no gallop and no friction rub.   No murmur heard. Pulmonary/Chest: Effort normal and breath sounds normal. No respiratory distress. She has no wheezes. She has no rales. She exhibits no tenderness. Right breast exhibits no inverted nipple, no mass, no nipple discharge, no skin change and no tenderness. Left breast exhibits no inverted nipple, no mass, no nipple discharge, no skin change and no tenderness. Breasts are symmetrical.  Abdominal: Soft. Bowel sounds are normal. She exhibits no distension and no mass. There is no tenderness. There is no rebound and no guarding. Hernia confirmed negative in the right inguinal area and confirmed negative in the left inguinal area.  Genitourinary: Rectum normal, vagina normal and uterus normal. No breast  swelling, tenderness, discharge or bleeding. Pelvic exam was performed with patient supine. There is no rash, tenderness, lesion or injury on the right labia. There is no rash, tenderness, lesion or injury on the left labia. Cervix exhibits no motion tenderness, no discharge and no friability. Right adnexum displays no mass, no tenderness and no fullness. Left adnexum displays no mass, no tenderness and no fullness. No erythema, tenderness or bleeding in the vagina. No signs of injury around the vagina. No vaginal discharge found.  Rectal deferred until next year since colonoscopy was in 2016 and normal; no polyps.  Musculoskeletal: Normal range of motion. She exhibits no edema or tenderness.  Lymphadenopathy:    She has no cervical adenopathy.       Right: No inguinal adenopathy  present.       Left: No inguinal adenopathy present.  Neurological: She is alert and oriented to person, place, and time. She has normal reflexes. No cranial nerve deficit. Coordination normal.  Skin: Skin is warm and dry. No rash noted. She is not diaphoretic.  Psychiatric: She has a normal mood and affect. Her behavior is normal. Judgment and thought content normal.  Vitals reviewed.    Depression Screen No flowsheet data found.    Assessment & Plan:     Routine Health Maintenance and Physical Exam  Exercise Activities and Dietary recommendations Goals    None      Immunization History  Administered Date(s) Administered  . Td 06/06/2003  . Tdap 11/21/2009  . Zoster 02/04/2012    Health Maintenance  Topic Date Due  . HIV Screening  11/14/1966  . PAP SMEAR  01/13/2014  . INFLUENZA VACCINE  02/26/2016  . MAMMOGRAM  11/09/2016  . TETANUS/TDAP  11/22/2019  . COLONOSCOPY  04/26/2025  . ZOSTAVAX  Completed  . Hepatitis C Screening  Completed      Discussed health benefits of physical activity, and encouraged her to engage in regular exercise appropriate for her age and condition.    1. Annual  physical exam Normal physical exam today. Will check labs as below and f/u pending lab results. If labs are stable and WNL she will not need to have these rechecked for one year at her next annual physical exam. She is to call the office in the meantime if she has any acute issue, questions or concerns. - CBC with Differential/Platelet - Comprehensive metabolic panel - Hemoglobin A1c - TSH  2. Breast cancer screening Breast exam today was normal. There is no family history of breast cancer. She does perform regular self breast exams. Mammogram was ordered as below. Information for [facility] Breast clinic was given to patient so she may schedule her mammogram at her convenience. - MM DIGITAL SCREENING BILATERAL; Future  3. Cervical cancer screening Pap collected today. Will send as below and f/u pending results. - Pap IG w/ reflex to HPV when ASC-U  4. Encounter for lipid screening for cardiovascular disease Will check labs and f/u pending lab results. - Lipid panel  5. Postmenopausal estrogen deficiency H/O osteoporosis. Went on a bisphosphonate for many years and had return to normal on most recent BMD. Will repeat as below and f/u pending results.  - DG Bone Density; Future  --------------------------------------------------------------------

## 2015-12-17 NOTE — Patient Instructions (Signed)

## 2015-12-18 ENCOUNTER — Telehealth: Payer: Self-pay

## 2015-12-18 DIAGNOSIS — R040 Epistaxis: Secondary | ICD-10-CM | POA: Diagnosis not present

## 2015-12-18 DIAGNOSIS — E7800 Pure hypercholesterolemia, unspecified: Secondary | ICD-10-CM | POA: Insufficient documentation

## 2015-12-18 DIAGNOSIS — R7989 Other specified abnormal findings of blood chemistry: Secondary | ICD-10-CM | POA: Insufficient documentation

## 2015-12-18 LAB — CBC WITH DIFFERENTIAL/PLATELET
BASOS: 1 %
Basophils Absolute: 0 10*3/uL (ref 0.0–0.2)
EOS (ABSOLUTE): 0.1 10*3/uL (ref 0.0–0.4)
EOS: 2 %
HEMATOCRIT: 34.5 % (ref 34.0–46.6)
HEMOGLOBIN: 11.4 g/dL (ref 11.1–15.9)
IMMATURE GRANS (ABS): 0 10*3/uL (ref 0.0–0.1)
IMMATURE GRANULOCYTES: 0 %
LYMPHS: 38 %
Lymphocytes Absolute: 1.7 10*3/uL (ref 0.7–3.1)
MCH: 27.9 pg (ref 26.6–33.0)
MCHC: 33 g/dL (ref 31.5–35.7)
MCV: 85 fL (ref 79–97)
MONOCYTES: 9 %
Monocytes Absolute: 0.4 10*3/uL (ref 0.1–0.9)
NEUTROS PCT: 50 %
Neutrophils Absolute: 2.3 10*3/uL (ref 1.4–7.0)
Platelets: 286 10*3/uL (ref 150–379)
RBC: 4.08 x10E6/uL (ref 3.77–5.28)
RDW: 14.7 % (ref 12.3–15.4)
WBC: 4.5 10*3/uL (ref 3.4–10.8)

## 2015-12-18 LAB — LIPID PANEL
CHOL/HDL RATIO: 3.9 ratio (ref 0.0–4.4)
Cholesterol, Total: 220 mg/dL — ABNORMAL HIGH (ref 100–199)
HDL: 56 mg/dL (ref >39)
LDL Calculated: 150 mg/dL — ABNORMAL HIGH (ref 0–99)
TRIGLYCERIDES: 72 mg/dL (ref 0–149)
VLDL CHOLESTEROL CAL: 14 mg/dL (ref 5–40)

## 2015-12-18 LAB — COMPREHENSIVE METABOLIC PANEL
ALBUMIN: 4.5 g/dL (ref 3.6–4.8)
ALK PHOS: 126 IU/L — AB (ref 39–117)
ALT: 13 IU/L (ref 0–32)
AST: 19 IU/L (ref 0–40)
Albumin/Globulin Ratio: 1.6 (ref 1.2–2.2)
BUN / CREAT RATIO: 21 (ref 12–28)
BUN: 14 mg/dL (ref 8–27)
Bilirubin Total: 0.4 mg/dL (ref 0.0–1.2)
CALCIUM: 9.9 mg/dL (ref 8.7–10.3)
CO2: 24 mmol/L (ref 18–29)
CREATININE: 0.66 mg/dL (ref 0.57–1.00)
Chloride: 100 mmol/L (ref 96–106)
GFR calc Af Amer: 108 mL/min/{1.73_m2} (ref >59)
GFR, EST NON AFRICAN AMERICAN: 94 mL/min/{1.73_m2} (ref >59)
GLOBULIN, TOTAL: 2.8 g/dL (ref 1.5–4.5)
GLUCOSE: 94 mg/dL (ref 65–99)
Potassium: 4.7 mmol/L (ref 3.5–5.2)
SODIUM: 142 mmol/L (ref 134–144)
TOTAL PROTEIN: 7.3 g/dL (ref 6.0–8.5)

## 2015-12-18 LAB — HEMOGLOBIN A1C
Est. average glucose Bld gHb Est-mCnc: 117 mg/dL
HEMOGLOBIN A1C: 5.7 % — AB (ref 4.8–5.6)

## 2015-12-18 LAB — TSH: TSH: 4.94 u[IU]/mL — AB (ref 0.450–4.500)

## 2015-12-18 MED ORDER — SIMVASTATIN 20 MG PO TABS: 20.0000 mg | ORAL_TABLET | Freq: Every day | ORAL | Status: DC

## 2015-12-18 NOTE — Telephone Encounter (Signed)
Patient advised as directed below. Patient will call back to schedule follow-up  Thanks,  -Joseline

## 2015-12-18 NOTE — Telephone Encounter (Signed)
-----   Message from Mar Daring, [place] sent at 12/18/2015  9:01 AM EDT ----- All labs are within normal limits and stable with exception of cholesterol. I do think that it is time to add a cholesterol lowering agent to reduce future risk of cardiovascular event. I will send in this medication to pharmacy. Also TSH was slightly off as well. I want to recheck thyroid panel in 4-6 weeks and can also see you to make sure you are tolerating the new medication for cholesterol. If you have any adverse effects (most common is muscle aches/pain) with the new medication please call the office before hand. Thanks! -JB

## 2015-12-18 NOTE — Addendum Note (Signed)
Addended by: Mar Daring on: 12/18/2015 09:03 AM   Modules accepted: Orders, SmartSet

## 2015-12-19 ENCOUNTER — Telehealth: Payer: Self-pay

## 2015-12-19 LAB — PAP IG W/ RFLX HPV ASCU: PAP SMEAR COMMENT: 0

## 2015-12-19 NOTE — Telephone Encounter (Signed)
Pt called back.  Could not get Joseline.  Pt asked about test results so I did tell her that her pap was normal and could repeat in 5 yrs if wanted.  Thanks Con Memos

## 2015-12-19 NOTE — Telephone Encounter (Signed)
-----   Message from Mar Daring, [place] sent at 12/19/2015  8:17 AM EDT ----- Pap is normal.  Will repeat in 5 years if wanted, not required.

## 2015-12-19 NOTE — Telephone Encounter (Signed)
Great, Thanks.

## 2015-12-19 NOTE — Telephone Encounter (Signed)
Patient had returned my call but unable to answer and I just call her back LM  Thanks,  -Adiva Boettner

## 2015-12-19 NOTE — Telephone Encounter (Signed)
LMTCB  Thanks,  -Avabella Wailes 

## 2015-12-26 ENCOUNTER — Ambulatory Visit (INDEPENDENT_AMBULATORY_CARE_PROVIDER_SITE_OTHER): Payer: 59 | Admitting: Family Medicine

## 2015-12-26 ENCOUNTER — Encounter: Payer: Self-pay | Admitting: Family Medicine

## 2015-12-26 VITALS — BP 122/78 | HR 84 | Temp 97.8°F | Resp 16 | Wt 163.0 lb

## 2015-12-26 DIAGNOSIS — E7800 Pure hypercholesterolemia, unspecified: Secondary | ICD-10-CM | POA: Diagnosis not present

## 2015-12-26 DIAGNOSIS — R21 Rash and other nonspecific skin eruption: Secondary | ICD-10-CM | POA: Diagnosis not present

## 2015-12-26 MED ORDER — RANITIDINE HCL 150 MG PO TABS: 150.0000 mg | ORAL_TABLET | Freq: Two times a day (BID) | ORAL | Status: DC

## 2015-12-26 MED ORDER — CETIRIZINE HCL 10 MG PO TABS: 10.0000 mg | ORAL_TABLET | Freq: Every day | ORAL | Status: DC

## 2015-12-26 NOTE — Progress Notes (Signed)
Patient ID: Lauren Sims, female   DOB: 04-02-1951, 65 y.o.   MRN: [id number]         Patient: Lauren Sims Female    DOB: May 25, 1951   65 y.o.   MRN: [id number] Visit Date: 12/26/2015  Today's Provider: Margarita Rana, MD   Chief Complaint  Patient presents with  . Rash   Subjective:    Rash This is a new problem. The current episode started in the past 7 days. The problem has been gradually worsening since onset. The rash is diffuse (Started on her right lower leg last week. Pt says it is now everywhere.). The rash is characterized by itchiness and redness. She was exposed to nothing. Past treatments include anti-itch cream and antihistamine. The treatment provided no relief.       Allergies  Allergen Reactions  . Codeine Nausea And Vomiting  . Shellfish Allergy Rash   Previous Medications   ASPIRIN 325 MG TABLET    Take 325 mg by mouth daily as needed.   BUTALBITAL-ACETAMINOPHEN-CAFFEINE (FIORICET, ESGIC) 50-325-40 MG TABLET    Take by mouth as needed.    CHOLECALCIFEROL (VITAMIN D PO)    Take 1 capsule by mouth daily.    ESCITALOPRAM (LEXAPRO) 10 MG TABLET    Take 1 tablet (10 mg total) by mouth daily. 1/2 a day for one week and then one a day.   LYSINE PO    Take 1 capsule by mouth every other day.    OMEGA-3 FATTY ACIDS (FISH OIL) 1000 MG CAPS    Take 1 capsule by mouth daily.    SIMVASTATIN (ZOCOR) 20 MG TABLET    Take 1 tablet (20 mg total) by mouth at bedtime.    Review of Systems  Constitutional: Negative.   Gastrointestinal: Negative.   Skin: Positive for rash. Negative for color change, pallor and wound.  Neurological: Negative for dizziness, light-headedness and headaches.    Social History  Substance Use Topics  . Smoking status: Never Smoker   . Smokeless tobacco: Never Used  . Alcohol Use: Yes     Comment: 1 glass wine every other week   Objective:   BP 122/78 mmHg  Pulse 84  Temp(Src) 97.8 F (36.6 C) (Oral)  Resp 16  Wt 163 lb (73.936  kg)  Physical Exam  Constitutional: She is oriented to person, place, and time. She appears well-developed and well-nourished.  Neurological: She is alert and oriented to person, place, and time.  Skin: Skin is warm and dry. Rash noted.  Psychiatric: She has a normal mood and affect. Her behavior is normal. Judgment and thought content normal.      Assessment & Plan:     1. Rash New problem. Unclear etiology.  Will start medication as below. Will taper off medication when improved.   - cetirizine (ZYRTEC) 10 MG tablet; Take 1 tablet (10 mg total) by mouth at bedtime.  Dispense: 30 tablet; Refill: 1 - ranitidine (ZANTAC) 150 MG tablet; Take 1 tablet (150 mg total) by mouth 2 (two) times daily.  Dispense: 60 tablet; Refill: 1  2. Hypercholesterolemia without hypertriglyceridemia Will stop cholesterol medication for now. Reviewed labs and  10 year risk of heart disease is 4.8 percent.  Work on lifestyle modifications and recheck annually.   Patient was seen and examined by Jerrell Belfast, MD, and note scribed by Ashley Royalty, CMA.  I have reviewed the document for accuracy and completeness and I agree with above. Jerrell Belfast,  MD   Margarita Rana, MD  [facility]

## 2016-01-08 ENCOUNTER — Ambulatory Visit
Admission: RE | Admit: 2016-01-08 | Discharge: 2016-01-08 | Disposition: A | Discharge status: Home / Self Care | Payer: 59 | Source: Ambulatory Visit | Attending: Physician Assistant | Admitting: Physician Assistant

## 2016-01-08 ENCOUNTER — Other Ambulatory Visit: Payer: Self-pay | Admitting: Physician Assistant

## 2016-01-08 DIAGNOSIS — M8588 Other specified disorders of bone density and structure, other site: Secondary | ICD-10-CM | POA: Insufficient documentation

## 2016-01-08 DIAGNOSIS — Z780 Asymptomatic menopausal state: Secondary | ICD-10-CM | POA: Insufficient documentation

## 2016-01-08 DIAGNOSIS — Z1239 Encounter for other screening for malignant neoplasm of breast: Secondary | ICD-10-CM

## 2016-01-08 DIAGNOSIS — M85851 Other specified disorders of bone density and structure, right thigh: Secondary | ICD-10-CM | POA: Diagnosis not present

## 2016-01-08 DIAGNOSIS — Z1231 Encounter for screening mammogram for malignant neoplasm of breast: Secondary | ICD-10-CM | POA: Insufficient documentation

## 2016-01-08 DIAGNOSIS — M8589 Other specified disorders of bone density and structure, multiple sites: Secondary | ICD-10-CM | POA: Diagnosis not present

## 2016-01-09 ENCOUNTER — Telehealth: Payer: Self-pay

## 2016-01-09 ENCOUNTER — Encounter: Payer: Self-pay | Admitting: Physician Assistant

## 2016-01-09 ENCOUNTER — Other Ambulatory Visit: Payer: Self-pay | Admitting: Physician Assistant

## 2016-01-09 DIAGNOSIS — R928 Other abnormal and inconclusive findings on diagnostic imaging of breast: Secondary | ICD-10-CM

## 2016-01-09 DIAGNOSIS — M8580 Other specified disorders of bone density and structure, unspecified site: Secondary | ICD-10-CM | POA: Insufficient documentation

## 2016-01-09 NOTE — Telephone Encounter (Signed)
-----   Message from Mar Daring, PA-C sent at 01/09/2016  8:26 AM EDT ----- Bone density shows osteopenia. Make sure to be taking a calcium + vitamin D supplement. Will repeat bone density in 2 years.

## 2016-01-09 NOTE — Telephone Encounter (Signed)
Left message to call back.  Thanks,  -Briyan Kleven

## 2016-01-09 NOTE — Telephone Encounter (Signed)
Patient advised as directed below.  Thanks,  -Joseline 

## 2016-01-22 ENCOUNTER — Ambulatory Visit
Admission: RE | Admit: 2016-01-22 | Discharge: 2016-01-22 | Disposition: A | Discharge status: Home / Self Care | Payer: 59 | Source: Ambulatory Visit | Attending: Physician Assistant | Admitting: Physician Assistant

## 2016-01-22 DIAGNOSIS — N6001 Solitary cyst of right breast: Secondary | ICD-10-CM | POA: Diagnosis not present

## 2016-01-22 DIAGNOSIS — R928 Other abnormal and inconclusive findings on diagnostic imaging of breast: Secondary | ICD-10-CM | POA: Insufficient documentation

## 2016-01-23 ENCOUNTER — Telehealth: Payer: Self-pay

## 2016-01-23 NOTE — Telephone Encounter (Signed)
Patient advised as directed below.  Thanks,  -Joseline 

## 2016-01-23 NOTE — Telephone Encounter (Signed)
-----   Message from Mar Daring, [place] sent at 01/23/2016  8:12 AM EDT ----- Cyst noted in right breast. Otherwise normal mammogram. Repeat screening in one year.

## 2016-02-21 DIAGNOSIS — S30861A Insect bite (nonvenomous) of abdominal wall, initial encounter: Secondary | ICD-10-CM | POA: Diagnosis not present

## 2016-03-07 DIAGNOSIS — T07 Unspecified multiple injuries: Secondary | ICD-10-CM | POA: Diagnosis not present

## 2016-05-15 DIAGNOSIS — Z23 Encounter for immunization: Secondary | ICD-10-CM | POA: Diagnosis not present

## 2016-12-17 ENCOUNTER — Encounter: Payer: 59 | Admitting: Physician Assistant

## 2016-12-26 ENCOUNTER — Encounter: Payer: Self-pay | Admitting: Physician Assistant

## 2016-12-26 ENCOUNTER — Ambulatory Visit (INDEPENDENT_AMBULATORY_CARE_PROVIDER_SITE_OTHER): Payer: 59 | Admitting: Physician Assistant

## 2016-12-26 VITALS — BP 110/70 | HR 76 | Temp 97.9°F | Resp 16 | Ht 66.0 in | Wt 170.2 lb

## 2016-12-26 DIAGNOSIS — E7800 Pure hypercholesterolemia, unspecified: Secondary | ICD-10-CM | POA: Diagnosis not present

## 2016-12-26 DIAGNOSIS — Z1231 Encounter for screening mammogram for malignant neoplasm of breast: Secondary | ICD-10-CM | POA: Diagnosis not present

## 2016-12-26 DIAGNOSIS — E559 Vitamin D deficiency, unspecified: Secondary | ICD-10-CM | POA: Diagnosis not present

## 2016-12-26 DIAGNOSIS — Z114 Encounter for screening for human immunodeficiency virus [HIV]: Secondary | ICD-10-CM | POA: Diagnosis not present

## 2016-12-26 DIAGNOSIS — F4322 Adjustment disorder with anxiety: Secondary | ICD-10-CM

## 2016-12-26 DIAGNOSIS — Z1239 Encounter for other screening for malignant neoplasm of breast: Secondary | ICD-10-CM

## 2016-12-26 DIAGNOSIS — G43709 Chronic migraine without aura, not intractable, without status migrainosus: Secondary | ICD-10-CM | POA: Diagnosis not present

## 2016-12-26 DIAGNOSIS — R946 Abnormal results of thyroid function studies: Secondary | ICD-10-CM

## 2016-12-26 DIAGNOSIS — Z0000 Encounter for general adult medical examination without abnormal findings: Secondary | ICD-10-CM

## 2016-12-26 DIAGNOSIS — R7989 Other specified abnormal findings of blood chemistry: Secondary | ICD-10-CM

## 2016-12-26 MED ORDER — ESCITALOPRAM OXALATE 10 MG PO TABS: ORAL_TABLET | ORAL | 5 refills | Status: DC

## 2016-12-26 MED ORDER — BUTALBITAL-APAP-CAFFEINE 50-325-40 MG PO TABS: 1.0000 | ORAL_TABLET | ORAL | 5 refills | Status: DC | PRN

## 2016-12-26 NOTE — Progress Notes (Addendum)
Patient: Lauren Sims, Female    DOB: 04-Oct-1950, 66 y.o.   MRN: [id number] Visit Date: 12/26/2016  Today's Provider: Mar Daring, PA-C   Chief Complaint  Patient presents with  . Annual Exam   Subjective:    Annual wellness visit Lauren Sims is a 66 y.o. female. She feels well. She reports exercising active with daily activities. She reports she is sleeping well. 12/17/15 CPE 01/22/16 Mammogram-BI-RADS 2 12/17/15 Pap-neg 04/27/15 colonoscopy-internal hemorrhoids, recheck in 10 years. 01/08/16 BMD-Osteopenia -----------------------------------------------------------  Patient is requesting refill on Fioricet, ESGIC patient reports she uses only as needed. Patient reports that she has not had it refilled in two years. Patient reports that she takes one tablet about once a month.  Patient requesting a refill on Lexapro, pt reports that she was taking it only for a short term.   Review of Systems  Constitutional: Negative.   HENT: Negative.   Eyes: Negative.   Respiratory: Negative.   Cardiovascular: Negative.   Gastrointestinal: Negative.   Endocrine: Negative.   Genitourinary: Negative.   Musculoskeletal: Negative.   Skin: Negative.   Allergic/Immunologic: Negative.   Neurological: Positive for headaches.  Hematological: Negative.   Psychiatric/Behavioral: Negative.     Social History   Social History  . Marital status: Married    Spouse name: N/A  . Number of children: 2  . Years of education: N/A   Occupational History  . Not on file.   Social History Main Topics  . Smoking status: Never Smoker  . Smokeless tobacco: Never Used  . Alcohol use Yes     Comment: 1 glass wine every other week  . Drug use: No  . Sexual activity: Not on file   Other Topics Concern  . Not on file   Social History Narrative  . No narrative on file    Past Medical History:  Diagnosis Date  . Arthritis    hips/knees  . Complication of anesthesia      slow to wake  . Dental crown present    cap - top, front. crown - top,rear  . Headache   . Mitral valve prolapse    followed by PCP     Patient Active Problem List   Diagnosis Date Noted  . Osteopenia 01/09/2016  . Hypercholesterolemia without hypertriglyceridemia 12/18/2015  . Abnormal thyroid blood test 12/18/2015  . Allergic rhinitis, seasonal 09/26/2015  . Adjustment disorder with anxiety 09/26/2015  . Special screening for malignant neoplasms, colon   . Headache, migraine 11/21/2009  . Avitaminosis D 10/17/2008  . Bone/cartilage disorder 10/05/2007  . Bloodgood disease 10/05/2007  . Cyst of ovary 03/08/2002  . Cardiac murmur 01/24/1997    Past Surgical History:  Procedure Laterality Date  . APPENDECTOMY    . COLONOSCOPY WITH PROPOFOL N/A 04/27/2015   Procedure: COLONOSCOPY WITH PROPOFOL;  Surgeon: Lucilla Lame, MD;  Location: [facility];  Service: Endoscopy;  Laterality: N/A;  . KNEE SURGERY      Her family history includes Cancer in her sister; Heart disease in her sister.      Current Outpatient Prescriptions:  .  aspirin 325 MG tablet, Take 325 mg by mouth daily as needed., Disp: , Rfl:  .  butalbital-acetaminophen-caffeine (FIORICET, ESGIC) 50-325-40 MG tablet, Take 1 tablet by mouth as needed., Disp: 30 tablet, Rfl: 5 .  LYSINE PO, Take 1 capsule by mouth every other day. , Disp: , Rfl:  .  Omega-3 Fatty Acids (FISH OIL) 1000  MG CAPS, Take 1 capsule by mouth daily. , Disp: , Rfl:  .  escitalopram (LEXAPRO) 10 MG tablet, Take 1/2 tab PO q hs x 1 week then increase to 1 tab PO q hs, Disp: 30 tablet, Rfl: 5  Patient Care Team: Mar Daring, PA-C as PCP - General (Family Medicine)     Objective:   Vitals: BP 110/70 (BP Location: Right Arm, Patient Position: Sitting, Cuff Size: Large)   Pulse 76   Temp 97.9 F (36.6 C) (Oral)   Resp 16   Ht 5\' 6"  (1.676 m)   Wt 170 lb 3.2 oz (77.2 kg)   BMI 27.47 kg/m   Physical Exam  Constitutional:  She is oriented to person, place, and time. She appears well-developed and well-nourished. No distress.  HENT:  Head: Normocephalic and atraumatic.  Right Ear: Hearing, tympanic membrane, external ear and ear canal normal.  Left Ear: Hearing, tympanic membrane, external ear and ear canal normal.  Nose: Nose normal.  Mouth/Throat: Uvula is midline, oropharynx is clear and moist and mucous membranes are normal. No oropharyngeal exudate.  Eyes: Conjunctivae and EOM are normal. Pupils are equal, round, and reactive to light. Right eye exhibits no discharge. Left eye exhibits no discharge. No scleral icterus.  Neck: Normal range of motion. Neck supple. No JVD present. Carotid bruit is not present. No tracheal deviation present. No thyromegaly present.  Cardiovascular: Normal rate, regular rhythm, normal heart sounds and intact distal pulses.  Exam reveals no gallop and no friction rub.   No murmur heard. Pulmonary/Chest: Effort normal and breath sounds normal. No respiratory distress. She has no wheezes. She has no rales. She exhibits no tenderness. Right breast exhibits no inverted nipple, no mass, no nipple discharge, no skin change and no tenderness. Left breast exhibits no inverted nipple, no mass, no nipple discharge, no skin change and no tenderness. Breasts are symmetrical.  Abdominal: Soft. Bowel sounds are normal. She exhibits no distension and no mass. There is no tenderness. There is no rebound and no guarding.  Musculoskeletal: Normal range of motion. She exhibits no edema or tenderness.  Lymphadenopathy:    She has no cervical adenopathy.  Neurological: She is alert and oriented to person, place, and time.  Skin: Skin is warm and dry. No rash noted. She is not diaphoretic.  Psychiatric: She has a normal mood and affect. Her behavior is normal. Judgment and thought content normal.  Vitals reviewed.   Activities of Daily Living In your present state of health, do you have any difficulty  performing the following activities: 12/26/2016  Hearing? N  Vision? N  Difficulty concentrating or making decisions? N  Walking or climbing stairs? N  Dressing or bathing? N  Doing errands, shopping? N  Some recent data might be hidden    Fall Risk Assessment Fall Risk  12/26/2016  Falls in the past year? No     Depression Screen PHQ 2/9 Scores 12/26/2016  PHQ - 2 Score 0  PHQ- 9 Score 0     Audit-C Alcohol Use Screening  Question Answer Points  How often do you have alcoholic drink? never 0  On days you do drink alcohol, how many drinks do you typically consume? 1 0  How oftey will you drink 6 or more in a total? never 0  Total Score:  0   A score of 3 or more in women, and 4 or more in men indicates increased risk for alcohol abuse, EXCEPT if all of the  points are from question 1.     Assessment & Plan:     Annual Wellness Visit  Reviewed patient's Family Medical History Reviewed and updated list of patient's medical providers Assessment of cognitive impairment was done Assessed patient's functional ability Established a written schedule for health screening [place] Completed and Reviewed  Exercise Activities and Dietary recommendations Goals    None      Immunization History  Administered Date(s) Administered  . Td 06/06/2003  . Tdap 11/21/2009  . Zoster 02/04/2012    Health Maintenance  Topic Date Due  . HIV Screening  11/14/1966  . PNA vac Low Risk Adult (1 of 2 - PCV13) 11/13/2016  . INFLUENZA VACCINE  02/25/2017  . MAMMOGRAM  01/07/2018  . PAP SMEAR  12/17/2018  . TETANUS/TDAP  11/22/2019  . COLONOSCOPY  04/26/2025  . DEXA SCAN  Completed  . Hepatitis C Screening  Completed     Discussed health benefits of physical activity, and encouraged her to engage in regular exercise appropriate for her age and condition.    1. Annual physical exam Normal physical exam today. Will check labs as below and f/u pending lab  results. If labs are stable and WNL she will not need to have these rechecked for one year at her next annual physical exam. She is to call the office in the meantime if she has any acute issue, questions or concerns. - CBC with Differential/Platelet  2. Breast cancer screening Breast exam today was normal. There is no family history of breast cancer. She does perform regular self breast exams. Mammogram was ordered as below. Information for [facility] was given to patient so she may schedule her mammogram at her convenience. - MM DIGITAL SCREENING BILATERAL; Future  3. Hypercholesterolemia without hypertriglyceridemia Stable. Diet controlled and using fish oil. Will check labs as below and f/u pending results. - CBC with Differential/Platelet - Comprehensive metabolic panel - Lipid Panel With LDL/HDL Ratio  4. Abnormal thyroid blood test Will check labs as below and f/u pending results. - TSH  5. Avitaminosis D H/O this and on supplement OTC. Will check labs as below and f/u pending results. - VITAMIN D 25 Hydroxy (Vit-D Deficiency, Fractures)  6. Screening for HIV (human immunodeficiency virus) - HIV antibody  7. Adjustment disorder with anxiety Stable. Diagnosis pulled for medication refill. Continue current medical treatment plan. - escitalopram (LEXAPRO) 10 MG tablet; Take 1/2 tab PO q hs x 1 week then increase to 1 tab PO q hs  Dispense: 30 tablet; Refill: 5  8. Chronic migraine without aura without status migrainosus, not intractable Stable. Diagnosis pulled for medication refill. Continue current medical treatment plan. - butalbital-acetaminophen-caffeine (FIORICET, ESGIC) 50-325-40 MG tablet; Take 1 tablet by mouth as needed.  Dispense: 30 tablet; Refill: 5  ------------------------------------------------------------------------------------------------------------    Mar Daring, PA-C  [facility]

## 2016-12-26 NOTE — Patient Instructions (Addendum)
Please call the [facility] at [facility] to schedule this at [phone].    Health Maintenance for Postmenopausal Women Menopause is a normal process in which your reproductive ability comes to an end. This process happens gradually over a span of months to years, usually between the ages of 48 and 55. Menopause is complete when you have missed 12 consecutive menstrual periods. It is important to talk with your health care provider about some of the most common conditions that affect postmenopausal women, such as heart disease, cancer, and bone loss (osteoporosis). Adopting a healthy lifestyle and getting preventive care can help to promote your health and wellness. Those actions can also lower your chances of developing some of these common conditions. What should I know about menopause? During menopause, you may experience a number of symptoms, such as:  Moderate-to-severe hot flashes.  Night sweats.  Decrease in sex drive.  Mood swings.  Headaches.  Tiredness.  Irritability.  Memory problems.  Insomnia.  Choosing to treat or not to treat menopausal changes is an individual decision that you make with your health care provider. What should I know about hormone replacement therapy and supplements? Hormone therapy products are effective for treating symptoms that are associated with menopause, such as hot flashes and night sweats. Hormone replacement carries certain risks, especially as you become older. If you are thinking about using estrogen or estrogen with progestin treatments, discuss the benefits and risks with your health care provider. What should I know about heart disease and stroke? Heart disease, heart attack, and stroke become more likely as you age. This may be due, in part, to the hormonal changes that your body experiences during menopause. These can affect how your body processes dietary fats, triglycerides, and cholesterol. Heart  attack and stroke are both medical emergencies. There are many things that you can do to help prevent heart disease and stroke:  Have your blood pressure checked at least every 1-2 years. High blood pressure causes heart disease and increases the risk of stroke.  If you are 55-79 years old, ask your health care provider if you should take aspirin to prevent a heart attack or a stroke.  Do not use any tobacco products, including cigarettes, chewing tobacco, or electronic cigarettes. If you need help quitting, ask your health care provider.  It is important to eat a healthy diet and maintain a healthy weight. ? Be sure to include plenty of vegetables, fruits, low-fat dairy products, and lean protein. ? Avoid eating foods that are high in solid fats, added sugars, or salt (sodium).  Get regular exercise. This is one of the most important things that you can do for your health. ? Try to exercise for at least 150 minutes each week. The type of exercise that you do should increase your heart rate and make you sweat. This is known as moderate-intensity exercise. ? Try to do strengthening exercises at least twice each week. Do these in addition to the moderate-intensity exercise.  Know your numbers.Ask your health care provider to check your cholesterol and your blood glucose. Continue to have your blood tested as directed by your health care provider.  What should I know about cancer screening? There are several types of cancer. Take the following steps to reduce your risk and to catch any cancer development as early as possible. Breast Cancer  Practice breast self-awareness. ? This means understanding how your breasts normally appear and feel. ? It also means doing regular   self-exams. Let your health care provider know about any changes, no matter how small.  If you are 86 or older, have a clinician do a breast exam (clinical breast exam or CBE) every year. Depending on your age, family  history, and medical history, it may be recommended that you also have a yearly breast X-ray (mammogram).  If you have a family history of breast cancer, talk with your health care provider about genetic screening.  If you are at high risk for breast cancer, talk with your health care provider about having an MRI and a mammogram every year.  Breast cancer (BRCA) gene test is recommended for women who have family members with BRCA-related cancers. Results of the assessment will determine the need for genetic counseling and BRCA1 and for BRCA2 testing. BRCA-related cancers include these types: ? Breast. This occurs in males or females. ? Ovarian. ? Tubal. This may also be called fallopian tube cancer. ? Cancer of the abdominal or pelvic lining (peritoneal cancer). ? Prostate. ? Pancreatic.  Cervical, Uterine, and Ovarian Cancer Your health care provider may recommend that you be screened regularly for cancer of the pelvic organs. These include your ovaries, uterus, and vagina. This screening involves a pelvic exam, which includes checking for microscopic changes to the surface of your cervix (Pap test).  For women ages 21-65, health care providers may recommend a pelvic exam and a Pap test every three years. For women ages 17-65, they may recommend the Pap test and pelvic exam, combined with testing for human papilloma virus (HPV), every five years. Some types of HPV increase your risk of cervical cancer. Testing for HPV may also be done on women of any age who have unclear Pap test results.  Other health care providers may not recommend any screening for nonpregnant women who are considered low risk for pelvic cancer and have no symptoms. Ask your health care provider if a screening pelvic exam is right for you.  If you have had past treatment for cervical cancer or a condition that could lead to cancer, you need Pap tests and screening for cancer for at least 20 years after your treatment. If  Pap tests have been discontinued for you, your risk factors (such as having a new sexual partner) need to be reassessed to determine if you should start having screenings again. Some women have medical problems that increase the chance of getting cervical cancer. In these cases, your health care provider may recommend that you have screening and Pap tests more often.  If you have a family history of uterine cancer or ovarian cancer, talk with your health care provider about genetic screening.  If you have vaginal bleeding after reaching menopause, tell your health care provider.  There are currently no reliable tests available to screen for ovarian cancer.  Lung Cancer Lung cancer screening is recommended for adults 73-101 years old who are at high risk for lung cancer because of a history of smoking. A yearly low-dose CT scan of the lungs is recommended if you:  Currently smoke.  Have a history of at least 30 pack-years of smoking and you currently smoke or have quit within the past 15 years. A pack-year is smoking an average of one pack of cigarettes per day for one year.  Yearly screening should:  Continue until it has been 15 years since you quit.  Stop if you develop a health problem that would prevent you from having lung cancer treatment.  Colorectal Cancer  This  type of cancer can be detected and can often be prevented.  Routine colorectal cancer screening usually begins at age 110 and continues through age 19.  If you have risk factors for colon cancer, your health care provider may recommend that you be screened at an earlier age.  If you have a family history of colorectal cancer, talk with your health care provider about genetic screening.  Your health care provider may also recommend using home test kits to check for hidden blood in your stool.  A small camera at the end of a tube can be used to examine your colon directly (sigmoidoscopy or colonoscopy). This is done to  check for the earliest forms of colorectal cancer.  Direct examination of the colon should be repeated every 5-10 years until age 37. However, if early forms of precancerous polyps or small growths are found or if you have a family history or genetic risk for colorectal cancer, you may need to be screened more often.  Skin Cancer  Check your skin from head to toe regularly.  Monitor any moles. Be sure to tell your health care provider: ? About any new moles or changes in moles, especially if there is a change in a mole's shape or color. ? If you have a mole that is larger than the size of a pencil eraser.  If any of your family members has a history of skin cancer, especially at a young age, talk with your health care provider about genetic screening.  Always use sunscreen. Apply sunscreen liberally and repeatedly throughout the day.  Whenever you are outside, protect yourself by wearing long sleeves, pants, a wide-brimmed hat, and sunglasses.  What should I know about osteoporosis? Osteoporosis is a condition in which bone destruction happens more quickly than new bone creation. After menopause, you may be at an increased risk for osteoporosis. To help prevent osteoporosis or the bone fractures that can happen because of osteoporosis, the following is recommended:  If you are 52-57 years old, get at least 1,000 mg of calcium and at least 600 mg of vitamin D per day.  If you are older than age 37 but younger than age 57, get at least 1,200 mg of calcium and at least 600 mg of vitamin D per day.  If you are older than age 51, get at least 1,200 mg of calcium and at least 800 mg of vitamin D per day.  Smoking and excessive alcohol intake increase the risk of osteoporosis. Eat foods that are rich in calcium and vitamin D, and do weight-bearing exercises several times each week as directed by your health care provider. What should I know about how menopause affects my mental  health? Depression may occur at any age, but it is more common as you become older. Common symptoms of depression include:  Low or sad mood.  Changes in sleep patterns.  Changes in appetite or eating patterns.  Feeling an overall lack of motivation or enjoyment of activities that you previously enjoyed.  Frequent crying spells.  Talk with your health care provider if you think that you are experiencing depression. What should I know about immunizations? It is important that you get and maintain your immunizations. These include:  Tetanus, diphtheria, and pertussis (Tdap) booster vaccine.  Influenza every year before the flu season begins.  Pneumonia vaccine.  Shingles vaccine.  Your health care provider may also recommend other immunizations. This information is not intended to replace advice given to you by your  health care provider. Make sure you discuss any questions you have with your health care provider. Document Released: 09/05/2005 Document Revised: 02/01/2016 Document Reviewed: 04/17/2015 Elsevier Interactive Patient Education  2018 [employer].

## 2016-12-27 LAB — CBC WITH DIFFERENTIAL/PLATELET
Basophils Absolute: 0 10*3/uL (ref 0.0–0.2)
Basos: 1 %
EOS (ABSOLUTE): 0.1 10*3/uL (ref 0.0–0.4)
EOS: 1 %
HEMATOCRIT: 38.3 % (ref 34.0–46.6)
Hemoglobin: 12.7 g/dL (ref 11.1–15.9)
IMMATURE GRANULOCYTES: 0 %
Immature Grans (Abs): 0 10*3/uL (ref 0.0–0.1)
LYMPHS ABS: 1.5 10*3/uL (ref 0.7–3.1)
Lymphs: 34 %
MCH: 27.9 pg (ref 26.6–33.0)
MCHC: 33.2 g/dL (ref 31.5–35.7)
MCV: 84 fL (ref 79–97)
MONOS ABS: 0.2 10*3/uL (ref 0.1–0.9)
Monocytes: 5 %
NEUTROS PCT: 59 %
Neutrophils Absolute: 2.6 10*3/uL (ref 1.4–7.0)
PLATELETS: 286 10*3/uL (ref 150–379)
RBC: 4.55 x10E6/uL (ref 3.77–5.28)
RDW: 14.4 % (ref 12.3–15.4)
WBC: 4.4 10*3/uL (ref 3.4–10.8)

## 2016-12-27 LAB — COMPREHENSIVE METABOLIC PANEL
A/G RATIO: 1.6 (ref 1.2–2.2)
ALK PHOS: 115 IU/L (ref 39–117)
ALT: 14 IU/L (ref 0–32)
AST: 18 IU/L (ref 0–40)
Albumin: 4.5 g/dL (ref 3.6–4.8)
BILIRUBIN TOTAL: 0.4 mg/dL (ref 0.0–1.2)
BUN/Creatinine Ratio: 16 (ref 12–28)
BUN: 11 mg/dL (ref 8–27)
CHLORIDE: 103 mmol/L (ref 96–106)
CO2: 25 mmol/L (ref 18–29)
Calcium: 9.6 mg/dL (ref 8.7–10.3)
Creatinine, Ser: 0.69 mg/dL (ref 0.57–1.00)
GFR calc Af Amer: 106 mL/min/{1.73_m2} (ref >59)
GFR calc non Af Amer: 92 mL/min/{1.73_m2} (ref >59)
GLOBULIN, TOTAL: 2.8 g/dL (ref 1.5–4.5)
Glucose: 96 mg/dL (ref 65–99)
POTASSIUM: 4.7 mmol/L (ref 3.5–5.2)
SODIUM: 142 mmol/L (ref 134–144)
Total Protein: 7.3 g/dL (ref 6.0–8.5)

## 2016-12-27 LAB — HIV ANTIBODY (ROUTINE TESTING W REFLEX): HIV Screen 4th Generation wRfx: NONREACTIVE

## 2016-12-27 LAB — LIPID PANEL WITH LDL/HDL RATIO
CHOLESTEROL TOTAL: 198 mg/dL (ref 100–199)
HDL: 60 mg/dL (ref >39)
LDL Calculated: 125 mg/dL — ABNORMAL HIGH (ref 0–99)
LDL/HDL RATIO: 2.1 ratio (ref 0.0–3.2)
TRIGLYCERIDES: 65 mg/dL (ref 0–149)
VLDL Cholesterol Cal: 13 mg/dL (ref 5–40)

## 2016-12-27 LAB — VITAMIN D 25 HYDROXY (VIT D DEFICIENCY, FRACTURES): Vit D, 25-Hydroxy: 18 ng/mL — ABNORMAL LOW (ref 30.0–100.0)

## 2016-12-27 LAB — TSH: TSH: 2.44 u[IU]/mL (ref 0.450–4.500)

## 2016-12-27 MED ORDER — VITAMIN D (ERGOCALCIFEROL) 1.25 MG (50000 UNIT) PO CAPS: 50000.0000 [IU] | ORAL_CAPSULE | ORAL | 1 refills | Status: DC

## 2016-12-27 NOTE — Addendum Note (Signed)
Addended by: Mar Daring on: 12/27/2016 09:55 AM   Modules accepted: Orders

## 2016-12-29 ENCOUNTER — Telehealth: Payer: Self-pay

## 2016-12-29 NOTE — Telephone Encounter (Signed)
-----   Message from Mar Daring, PA-C sent at 12/27/2016  9:54 AM EDT ----- Cholesterol has improved. Vit D is low. Will send in Rx Vit D for you to take once weekly. All other labs are stable and WNL.

## 2016-12-29 NOTE — Telephone Encounter (Signed)
Patient advised as below.  

## 2017-01-13 DIAGNOSIS — L821 Other seborrheic keratosis: Secondary | ICD-10-CM | POA: Diagnosis not present

## 2017-01-16 ENCOUNTER — Ambulatory Visit
Admission: RE | Admit: 2017-01-16 | Discharge: 2017-01-16 | Disposition: A | Discharge status: Home / Self Care | Payer: 59 | Source: Ambulatory Visit | Attending: Physician Assistant | Admitting: Physician Assistant

## 2017-01-16 DIAGNOSIS — Z1231 Encounter for screening mammogram for malignant neoplasm of breast: Secondary | ICD-10-CM | POA: Insufficient documentation

## 2017-01-16 DIAGNOSIS — Z1239 Encounter for other screening for malignant neoplasm of breast: Secondary | ICD-10-CM

## 2017-02-13 DIAGNOSIS — D1801 Hemangioma of skin and subcutaneous tissue: Secondary | ICD-10-CM | POA: Diagnosis not present

## 2017-02-13 DIAGNOSIS — L720 Epidermal cyst: Secondary | ICD-10-CM | POA: Diagnosis not present

## 2017-09-14 IMAGING — MG MM DIGITAL SCREENING BILAT W/ TOMO W/ CAD
8 of 13 series · 8 of 29 positions shown · non-contrast
Comparison: Previous exam(s).

CLINICAL DATA: Screening.

EXAM:
2D DIGITAL SCREENING BILATERAL MAMMOGRAM WITH CAD AND ADJUNCT TOMO

[L MLO (1 of 2)]
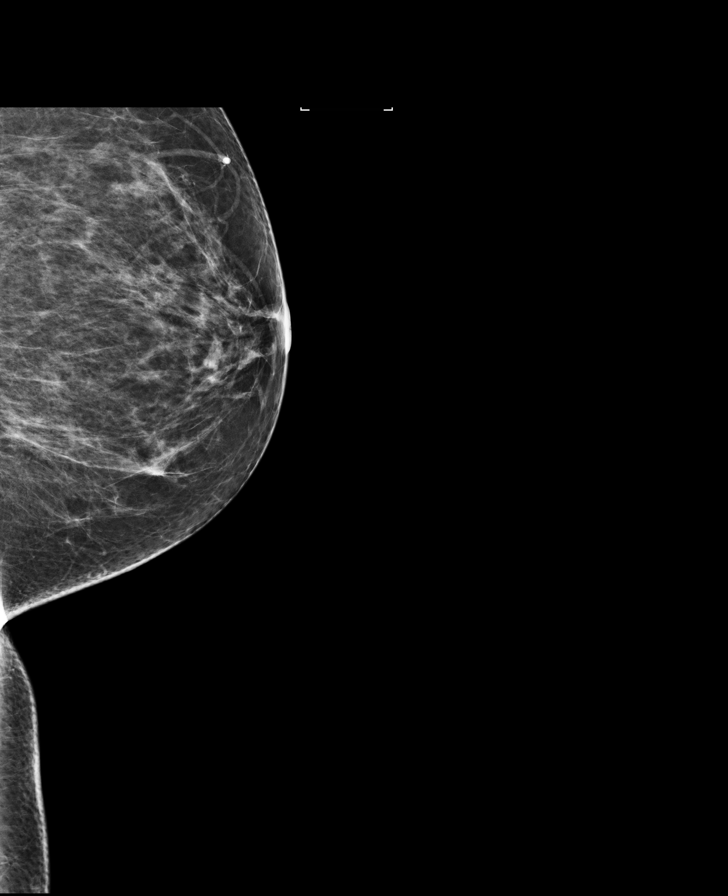

[L MLO synth-2D]
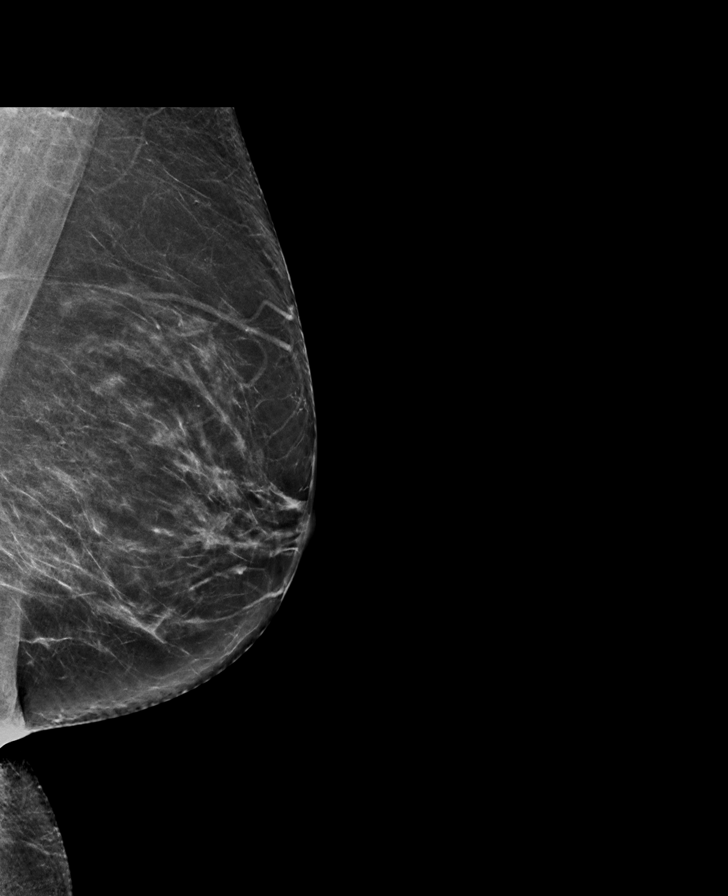

[R MLO]
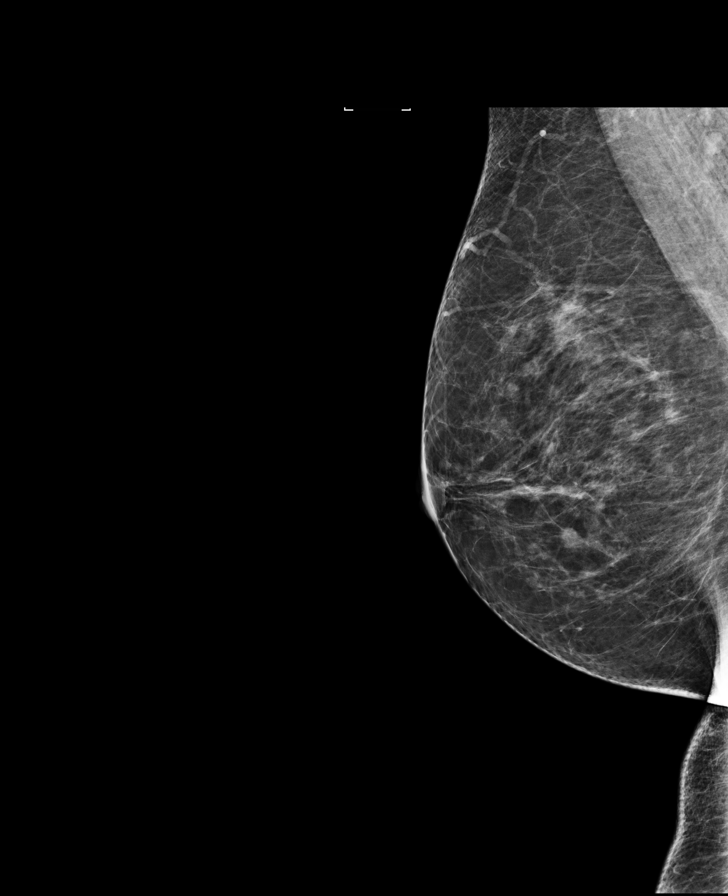

[L CC]
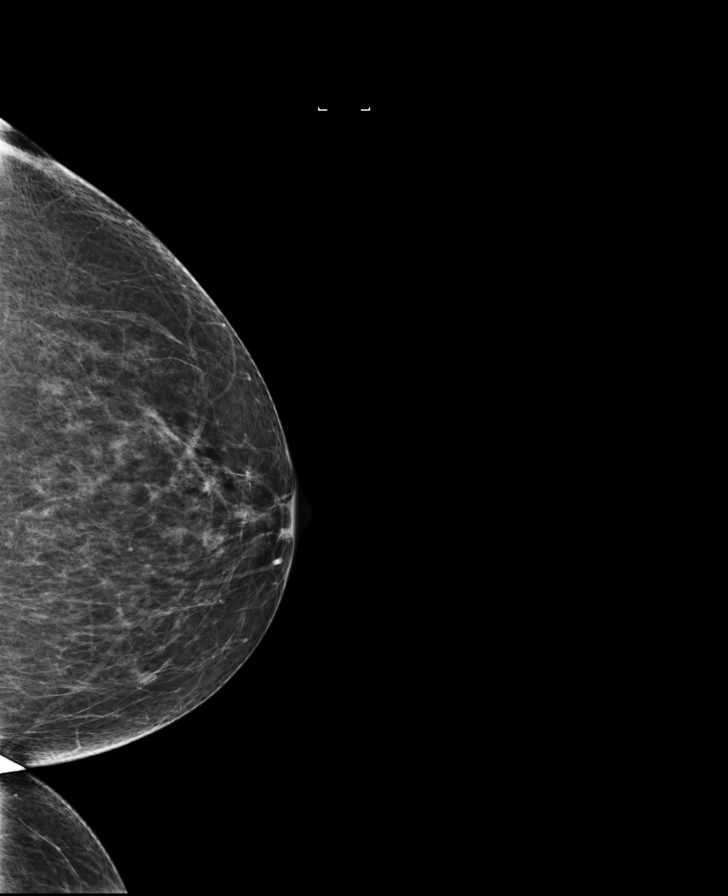

[R CC]
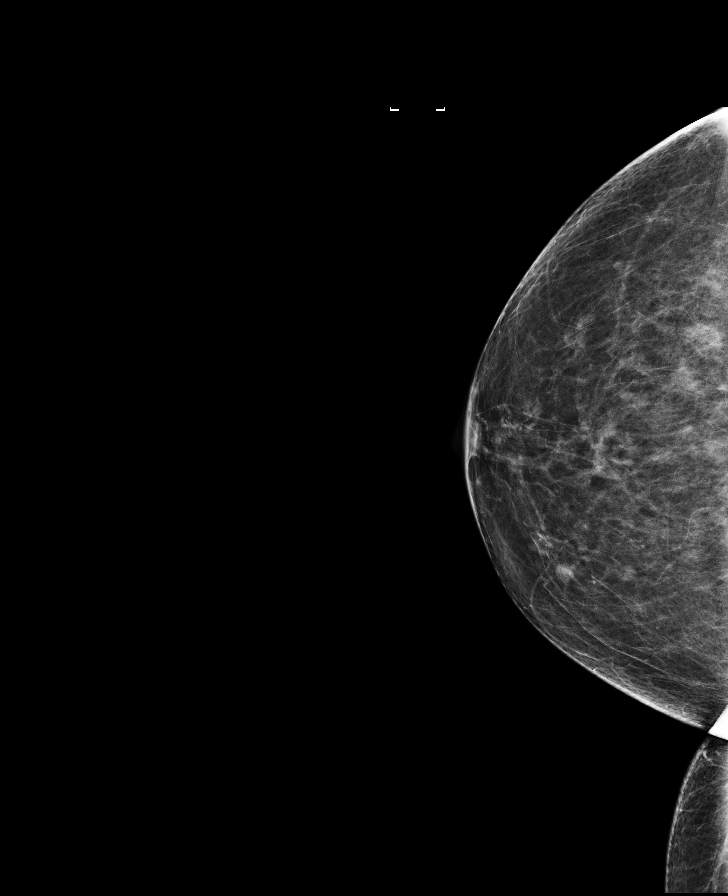

[R CC synth-2D]
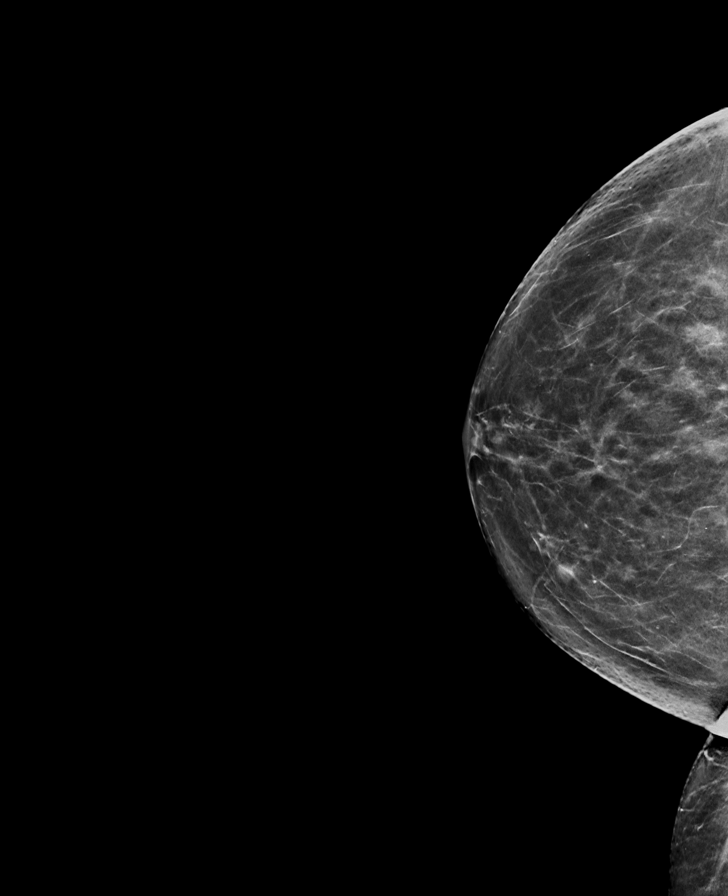

[R MLO synth-2D]
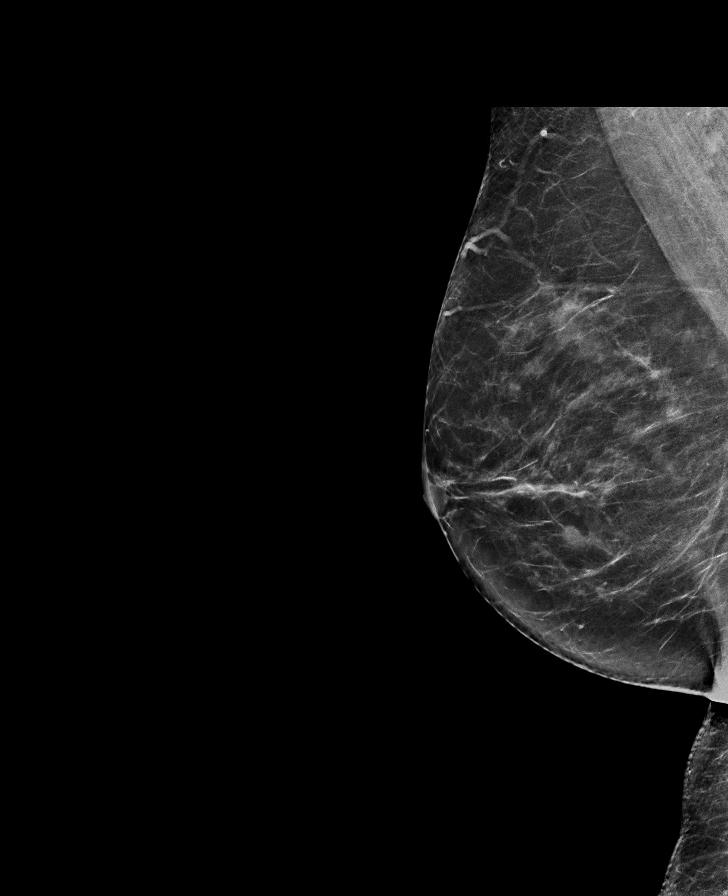

[L MLO (2 of 2)]
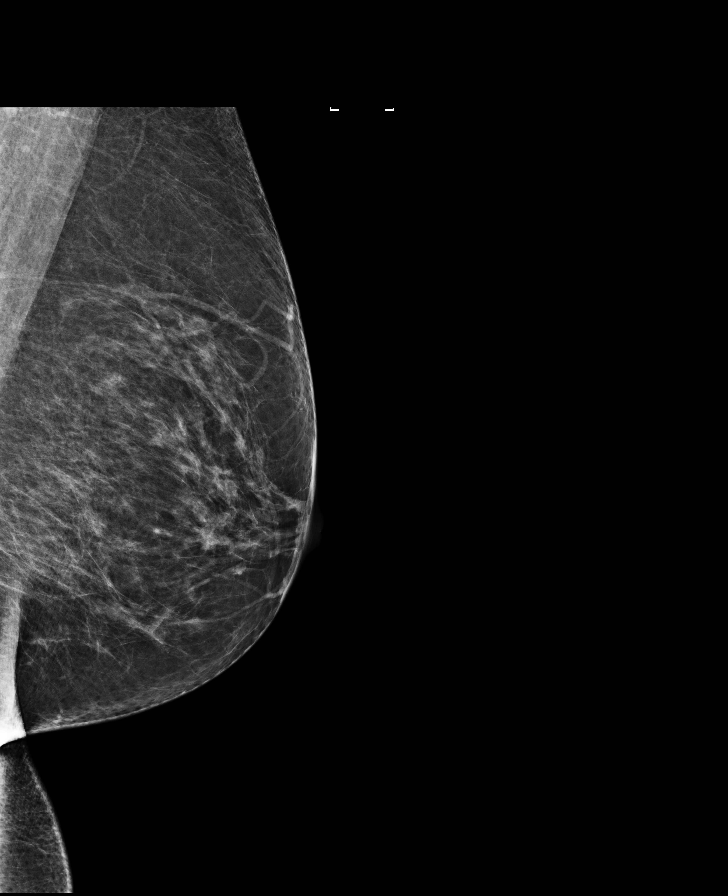

[8 of 29 positions shown; findings below may reference images not displayed]

ACR Breast Density Category b: There are scattered areas of
fibroglandular density.
FINDINGS: In the right breast, a possible mass warrants further evaluation. In
the left breast, no findings suspicious for malignancy. Images were
processed with CAD.
IMPRESSION: Further evaluation is suggested for possible mass in the right
breast.

RECOMMENDATION:
Diagnostic mammogram and possibly ultrasound of the right breast.
(Code:[id number])

The patient will be contacted regarding the findings, and additional
imaging will be scheduled.

BI-RADS CATEGORY  0: Incomplete. Need additional imaging evaluation
and/or prior mammograms for comparison.

## 2018-01-01 ENCOUNTER — Encounter: Payer: Self-pay | Admitting: Physician Assistant

## 2018-01-01 ENCOUNTER — Ambulatory Visit (INDEPENDENT_AMBULATORY_CARE_PROVIDER_SITE_OTHER): Payer: 59 | Admitting: Physician Assistant

## 2018-01-01 VITALS — BP 142/80 | HR 68 | Temp 97.7°F | Resp 20 | Ht 66.0 in | Wt 175.0 lb

## 2018-01-01 DIAGNOSIS — E559 Vitamin D deficiency, unspecified: Secondary | ICD-10-CM

## 2018-01-01 DIAGNOSIS — Z23 Encounter for immunization: Secondary | ICD-10-CM

## 2018-01-01 DIAGNOSIS — Z0000 Encounter for general adult medical examination without abnormal findings: Secondary | ICD-10-CM | POA: Diagnosis not present

## 2018-01-01 DIAGNOSIS — E7800 Pure hypercholesterolemia, unspecified: Secondary | ICD-10-CM

## 2018-01-01 DIAGNOSIS — Z6828 Body mass index (BMI) 28.0-28.9, adult: Secondary | ICD-10-CM

## 2018-01-01 DIAGNOSIS — R7989 Other specified abnormal findings of blood chemistry: Secondary | ICD-10-CM

## 2018-01-01 DIAGNOSIS — Z1231 Encounter for screening mammogram for malignant neoplasm of breast: Secondary | ICD-10-CM | POA: Diagnosis not present

## 2018-01-01 DIAGNOSIS — M8589 Other specified disorders of bone density and structure, multiple sites: Secondary | ICD-10-CM | POA: Diagnosis not present

## 2018-01-01 DIAGNOSIS — G43709 Chronic migraine without aura, not intractable, without status migrainosus: Secondary | ICD-10-CM

## 2018-01-01 DIAGNOSIS — Z1239 Encounter for other screening for malignant neoplasm of breast: Secondary | ICD-10-CM

## 2018-01-01 DIAGNOSIS — F4322 Adjustment disorder with anxiety: Secondary | ICD-10-CM

## 2018-01-01 MED ORDER — ESCITALOPRAM OXALATE 10 MG PO TABS: ORAL_TABLET | ORAL | 5 refills | Status: DC

## 2018-01-01 MED ORDER — BUTALBITAL-APAP-CAFFEINE 50-325-40 MG PO TABS: 1.0000 | ORAL_TABLET | ORAL | 5 refills | Status: DC | PRN

## 2018-01-01 NOTE — Progress Notes (Signed)
Patient: Lauren Sims, Female    DOB: 02-10-1951, 67 y.o.   MRN: [id number] Visit Date: 01/01/2018  Today's Provider: Mar Daring, PA-C   Chief Complaint  Patient presents with  . Annual Exam   Subjective:    Annual physical exam Lauren Sims is a 67 y.o. female who presents today for health maintenance and complete physical. She feels well. She reports exercising 2 days week. She reports she is sleeping well.  12/26/16 CPE 01/16/17 Mammogram-BI-RADS 1 12/17/15 Pap-normal 04/27/15 Colonoscopy-normal -----------------------------------------------------------------   Review of Systems  Constitutional: Positive for fatigue.  HENT: Positive for tinnitus.   Eyes: Negative.   Respiratory: Negative.   Cardiovascular: Negative.   Gastrointestinal: Negative.   Endocrine: Negative.   Genitourinary: Negative.   Musculoskeletal: Negative.   Skin: Negative.   Allergic/Immunologic: Negative.   Neurological: Positive for headaches.  Hematological: Negative.   Psychiatric/Behavioral: Negative.     Social History      She  reports that she has never smoked. She has never used smokeless tobacco. She reports that she drinks alcohol. She reports that she does not use drugs.       Social History   Socioeconomic History  . Marital status: Married    Spouse name: Not on file  . Number of children: 2  . Years of education: Not on file  . Highest education level: Not on file  Occupational History  . Not on file  Social Needs  . Financial resource strain: Not on file  . Food insecurity:    Worry: Not on file    Inability: Not on file  . Transportation needs:    Medical: Not on file    Non-medical: Not on file  Tobacco Use  . Smoking status: Never Smoker  . Smokeless tobacco: Never Used  Substance and Sexual Activity  . Alcohol use: Yes    Comment: 1 glass wine every other week  . Drug use: No  . Sexual activity: Not on file  Lifestyle  . Physical  activity:    Days per week: Not on file    Minutes per session: Not on file  . Stress: Not on file  Relationships  . Social connections:    Talks on phone: Not on file    Gets together: Not on file    Attends religious service: Not on file    Active member of club or organization: Not on file    Attends meetings of clubs or organizations: Not on file    Relationship status: Not on file  Other Topics Concern  . Not on file  Social History Narrative  . Not on file    Past Medical History:  Diagnosis Date  . Arthritis    hips/knees  . Complication of anesthesia    slow to wake  . Dental crown present    cap - top, front. crown - top,rear  . Headache   . Mitral valve prolapse    followed by PCP     Patient Active Problem List   Diagnosis Date Noted  . Osteopenia 01/09/2016  . Hypercholesterolemia without hypertriglyceridemia 12/18/2015  . Abnormal thyroid blood test 12/18/2015  . Allergic rhinitis, seasonal 09/26/2015  . Adjustment disorder with anxiety 09/26/2015  . Special screening for malignant neoplasms, colon   . Headache, migraine 11/21/2009  . Avitaminosis D 10/17/2008  . Bone/cartilage disorder 10/05/2007  . Bloodgood disease 10/05/2007  . Cyst of ovary 03/08/2002  . Cardiac murmur 01/24/1997  Past Surgical History:  Procedure Laterality Date  . APPENDECTOMY    . COLONOSCOPY WITH PROPOFOL N/A 04/27/2015   Procedure: COLONOSCOPY WITH PROPOFOL;  Surgeon: Lucilla Lame, MD;  Location: [facility];  Service: Endoscopy;  Laterality: N/A;  . KNEE SURGERY      Family History        Family Status  Relation Name Status  . Mother  Aloha Gell, age 76y       Healthy  . Father  Deceased at age 58       2013 heart failure  . Sister  Aloha Gell, age 87y  . Brother  56, age 80y  . Daughter  Alive  . Sister  Alive  . Sister  Deceased at age 1       Kidney Cancer  . Daughter  Alive  . Neg Hx  (Not Specified)        Her family history includes Cancer in her  sister; Glaucoma in her mother; Hearing loss in her mother; Heart disease in her sister. There is no history of Breast cancer.      Allergies  Allergen Reactions  . Codeine Nausea And Vomiting  . Shellfish Allergy Rash     Current Outpatient Medications:  .  aspirin 325 MG tablet, Take 325 mg by mouth daily as needed., Disp: , Rfl:  .  butalbital-acetaminophen-caffeine (FIORICET, ESGIC) 50-325-40 MG tablet, Take 1 tablet by mouth as needed., Disp: 30 tablet, Rfl: 5 .  LYSINE PO, Take 1 capsule by mouth every other day. , Disp: , Rfl:  .  Omega-3 Fatty Acids (FISH OIL) 1000 MG CAPS, Take 1 capsule by mouth daily. , Disp: , Rfl:  .  Vitamin D, Ergocalciferol, (DRISDOL) 50000 units CAPS capsule, Take 1 capsule (50,000 Units total) by mouth every 7 (seven) days., Disp: 12 capsule, Rfl: 1 .  escitalopram (LEXAPRO) 10 MG tablet, Take 1/2 tab PO q hs x 1 week then increase to 1 tab PO q hs (Patient not taking: Reported on 01/01/2018), Disp: 30 tablet, Rfl: 5   Patient Care Team: Mar Daring, PA-C as PCP - General (Family Medicine)      Objective:   Vitals: BP (!) 142/80 (BP Location: Left Arm, Patient Position: Sitting, Cuff Size: Normal)   Pulse 68   Temp 97.7 F (36.5 C) (Oral)   Resp 20   Ht 5\' 6"  (1.676 m)   Wt 175 lb (79.4 kg)   SpO2 99%   BMI 28.25 kg/m    Vitals:   01/01/18 0934  BP: (!) 142/80  Pulse: 68  Resp: 20  Temp: 97.7 F (36.5 C)  TempSrc: Oral  SpO2: 99%  Weight: 175 lb (79.4 kg)  Height: 5\' 6"  (1.676 m)     Physical Exam  Constitutional: She is oriented to person, place, and time. She appears well-developed and well-nourished. No distress.  HENT:  Head: Normocephalic and atraumatic.  Right Ear: Hearing, tympanic membrane, external ear and ear canal normal.  Left Ear: Hearing, tympanic membrane, external ear and ear canal normal.  Nose: Nose normal.  Mouth/Throat: Uvula is midline, oropharynx is clear and moist and mucous membranes are normal.  No oropharyngeal exudate.  Eyes: Pupils are equal, round, and reactive to light. Conjunctivae and EOM are normal. Right eye exhibits no discharge. Left eye exhibits no discharge. No scleral icterus.  Neck: Normal range of motion. Neck supple. No JVD present. Carotid bruit is not present. No tracheal deviation present. No thyromegaly present.  Cardiovascular: Normal  rate, regular rhythm, normal heart sounds and intact distal pulses. Exam reveals no gallop and no friction rub.  No murmur heard. Pulmonary/Chest: Effort normal and breath sounds normal. No respiratory distress. She has no wheezes. She has no rales. She exhibits no tenderness. Right breast exhibits no inverted nipple, no mass, no nipple discharge, no skin change and no tenderness. Left breast exhibits no inverted nipple, no mass, no nipple discharge, no skin change and no tenderness. No breast swelling, tenderness, discharge or bleeding. Breasts are symmetrical.  Abdominal: Soft. Bowel sounds are normal. She exhibits no distension and no mass. There is no tenderness. There is no rebound and no guarding.  Musculoskeletal: Normal range of motion. She exhibits no edema or tenderness.  Lymphadenopathy:    She has no cervical adenopathy.  Neurological: She is alert and oriented to person, place, and time.  Skin: Skin is warm and dry. No rash noted. She is not diaphoretic.  Psychiatric: She has a normal mood and affect. Her behavior is normal. Judgment and thought content normal.  Vitals reviewed.    Depression Screen PHQ 2/9 Scores 01/01/2018 12/26/2016  PHQ - 2 Score 0 0  PHQ- 9 Score 2 0      Assessment & Plan:     Routine Health Maintenance and Physical Exam  Exercise Activities and Dietary recommendations Goals    None      Immunization History  Administered Date(s) Administered  . Td 06/06/2003  . Tdap 11/21/2009  . Zoster 02/04/2012    Health Maintenance  Topic Date Due  . PNA vac Low Risk Adult (1 of 2 - PCV13)  11/13/2016  . INFLUENZA VACCINE  02/25/2018  . MAMMOGRAM  01/17/2019  . TETANUS/TDAP  11/22/2019  . COLONOSCOPY  04/26/2025  . DEXA SCAN  Completed  . Hepatitis C Screening  Completed     Discussed health benefits of physical activity, and encouraged her to engage in regular exercise appropriate for her age and condition.    1. Annual physical exam Normal physical exam today. Will check labs as below and f/u pending lab results. If labs are stable and WNL she will not need to have these rechecked for one year at her next annual physical exam. She is to call the office in the meantime if she has any acute issue, questions or concerns. - CBC w/Diff/Platelet - Comprehensive Metabolic Panel (CMET) - TSH - Lipid Profile - Vitamin D (25 hydroxy)  2. Breast cancer screening Breast exam today was normal. There is no family history of breast cancer. She does perform regular self breast exams. Mammogram was ordered as below. Information for [facility] was given to patient so she may schedule her mammogram at her convenience. - MM Digital Screening; Future  3. Adjustment disorder with anxiety Stable. Diagnosis pulled for medication refill. Continue current medical treatment plan. - escitalopram (LEXAPRO) 10 MG tablet; Take 1/2 tab PO q hs x 1 week then increase to 1 tab PO q hs  Dispense: 30 tablet; Refill: 5  4. Chronic migraine without aura without status migrainosus, not intractable Stable. Diagnosis pulled for medication refill. Continue current medical treatment plan. - butalbital-acetaminophen-caffeine (FIORICET, ESGIC) 50-325-40 MG tablet; Take 1 tablet by mouth as needed.  Dispense: 30 tablet; Refill: 5  5. Osteopenia of multiple sites BMD in 2017 had T score -2.1. Patient desires to wait for repeat BMD until next year. Of note, patient has used Boniva in past and was stopped after 2 years due to improvement in  bone health.   6. Abnormal thyroid blood test Will check labs  as below and f/u pending results. - TSH  7. Hypercholesterolemia without hypertriglyceridemia Diet controlled. Will check labs as below and f/u pending results. - CBC w/Diff/Platelet - Comprehensive Metabolic Panel (CMET) - Lipid Profile  8. Avitaminosis D H/O this. Has required high dose supplementation in the past. Not currently on anything. Will check labs as below and f/u pending results. - CBC w/Diff/Platelet - Comprehensive Metabolic Panel (CMET) - Vitamin D (25 hydroxy)  9. BMI 28.0-28.9,adult Counseled patient on healthy lifestyle modifications including dieting and exercise.  - CBC w/Diff/Platelet - Comprehensive Metabolic Panel (CMET) - Lipid Profile  10. Need for pneumococcal vaccination Prevnar 13 Vaccine given to patient without complications. Patient sat for 15 minutes after administration and was tolerated well without adverse effects. - Pneumococcal conjugate vaccine 13-valent  --------------------------------------------------------------------    Mar Daring, PA-C  [facility]

## 2018-01-01 NOTE — Patient Instructions (Signed)
Health Maintenance for Postmenopausal Women Menopause is a normal process in which your reproductive ability comes to an end. This process happens gradually over a span of months to years, usually between the ages of 22 and 9. Menopause is complete when you have missed 12 consecutive menstrual periods. It is important to talk with your health care provider about some of the most common conditions that affect postmenopausal women, such as heart disease, cancer, and bone loss (osteoporosis). Adopting a healthy lifestyle and getting preventive care can help to promote your health and wellness. Those actions can also lower your chances of developing some of these common conditions. What should I know about menopause? During menopause, you may experience a number of symptoms, such as:  Moderate-to-severe hot flashes.  Night sweats.  Decrease in sex drive.  Mood swings.  Headaches.  Tiredness.  Irritability.  Memory problems.  Insomnia.  Choosing to treat or not to treat menopausal changes is an individual decision that you make with your health care provider. What should I know about hormone replacement therapy and supplements? Hormone therapy products are effective for treating symptoms that are associated with menopause, such as hot flashes and night sweats. Hormone replacement carries certain risks, especially as you become older. If you are thinking about using estrogen or estrogen with progestin treatments, discuss the benefits and risks with your health care provider. What should I know about heart disease and stroke? Heart disease, heart attack, and stroke become more likely as you age. This may be due, in part, to the hormonal changes that your body experiences during menopause. These can affect how your body processes dietary fats, triglycerides, and cholesterol. Heart attack and stroke are both medical emergencies. There are many things that you can do to help prevent heart disease  and stroke:  Have your blood pressure checked at least every 1-2 years. High blood pressure causes heart disease and increases the risk of stroke.  If you are 53-22 years old, ask your health care provider if you should take aspirin to prevent a heart attack or a stroke.  Do not use any tobacco products, including cigarettes, chewing tobacco, or electronic cigarettes. If you need help quitting, ask your health care provider.  It is important to eat a healthy diet and maintain a healthy weight. ? Be sure to include plenty of vegetables, fruits, low-fat dairy products, and lean protein. ? Avoid eating foods that are high in solid fats, added sugars, or salt (sodium).  Get regular exercise. This is one of the most important things that you can do for your health. ? Try to exercise for at least 150 minutes each week. The type of exercise that you do should increase your heart rate and make you sweat. This is known as moderate-intensity exercise. ? Try to do strengthening exercises at least twice each week. Do these in addition to the moderate-intensity exercise.  Know your numbers.Ask your health care provider to check your cholesterol and your blood glucose. Continue to have your blood tested as directed by your health care provider.  What should I know about cancer screening? There are several types of cancer. Take the following steps to reduce your risk and to catch any cancer development as early as possible. Breast Cancer  Practice breast self-awareness. ? This means understanding how your breasts normally appear and feel. ? It also means doing regular breast self-exams. Let your health care provider know about any changes, no matter how small.  If you are 40  or older, have a clinician do a breast exam (clinical breast exam or CBE) every year. Depending on your age, family history, and medical history, it may be recommended that you also have a yearly breast X-ray (mammogram).  If you  have a family history of breast cancer, talk with your health care provider about genetic screening.  If you are at high risk for breast cancer, talk with your health care provider about having an MRI and a mammogram every year.  Breast cancer (BRCA) gene test is recommended for women who have family members with BRCA-related cancers. Results of the assessment will determine the need for genetic counseling and BRCA1 and for BRCA2 testing. BRCA-related cancers include these types: ? Breast. This occurs in males or females. ? Ovarian. ? Tubal. This may also be called fallopian tube cancer. ? Cancer of the abdominal or pelvic lining (peritoneal cancer). ? Prostate. ? Pancreatic.  Cervical, Uterine, and Ovarian Cancer Your health care provider may recommend that you be screened regularly for cancer of the pelvic organs. These include your ovaries, uterus, and vagina. This screening involves a pelvic exam, which includes checking for microscopic changes to the surface of your cervix (Pap test).  For women ages 21-65, health care providers may recommend a pelvic exam and a Pap test every three years. For women ages 79-65, they may recommend the Pap test and pelvic exam, combined with testing for human papilloma virus (HPV), every five years. Some types of HPV increase your risk of cervical cancer. Testing for HPV may also be done on women of any age who have unclear Pap test results.  Other health care providers may not recommend any screening for nonpregnant women who are considered low risk for pelvic cancer and have no symptoms. Ask your health care provider if a screening pelvic exam is right for you.  If you have had past treatment for cervical cancer or a condition that could lead to cancer, you need Pap tests and screening for cancer for at least 20 years after your treatment. If Pap tests have been discontinued for you, your risk factors (such as having a new sexual partner) need to be  reassessed to determine if you should start having screenings again. Some women have medical problems that increase the chance of getting cervical cancer. In these cases, your health care provider may recommend that you have screening and Pap tests more often.  If you have a family history of uterine cancer or ovarian cancer, talk with your health care provider about genetic screening.  If you have vaginal bleeding after reaching menopause, tell your health care provider.  There are currently no reliable tests available to screen for ovarian cancer.  Lung Cancer Lung cancer screening is recommended for adults 69-62 years old who are at high risk for lung cancer because of a history of smoking. A yearly low-dose CT scan of the lungs is recommended if you:  Currently smoke.  Have a history of at least 30 pack-years of smoking and you currently smoke or have quit within the past 15 years. A pack-year is smoking an average of one pack of cigarettes per day for one year.  Yearly screening should:  Continue until it has been 15 years since you quit.  Stop if you develop a health problem that would prevent you from having lung cancer treatment.  Colorectal Cancer  This type of cancer can be detected and can often be prevented.  Routine colorectal cancer screening usually begins at  age 42 and continues through age 45.  If you have risk factors for colon cancer, your health care provider may recommend that you be screened at an earlier age.  If you have a family history of colorectal cancer, talk with your health care provider about genetic screening.  Your health care provider may also recommend using home test kits to check for hidden blood in your stool.  A small camera at the end of a tube can be used to examine your colon directly (sigmoidoscopy or colonoscopy). This is done to check for the earliest forms of colorectal cancer.  Direct examination of the colon should be repeated every  5-10 years until age 71. However, if early forms of precancerous polyps or small growths are found or if you have a family history or genetic risk for colorectal cancer, you may need to be screened more often.  Skin Cancer  Check your skin from head to toe regularly.  Monitor any moles. Be sure to tell your health care provider: ? About any new moles or changes in moles, especially if there is a change in a mole's shape or color. ? If you have a mole that is larger than the size of a pencil eraser.  If any of your family members has a history of skin cancer, especially at a young age, talk with your health care provider about genetic screening.  Always use sunscreen. Apply sunscreen liberally and repeatedly throughout the day.  Whenever you are outside, protect yourself by wearing long sleeves, pants, a wide-brimmed hat, and sunglasses.  What should I know about osteoporosis? Osteoporosis is a condition in which bone destruction happens more quickly than new bone creation. After menopause, you may be at an increased risk for osteoporosis. To help prevent osteoporosis or the bone fractures that can happen because of osteoporosis, the following is recommended:  If you are 46-71 years old, get at least 1,000 mg of calcium and at least 600 mg of vitamin D per day.  If you are older than age 55 but younger than age 65, get at least 1,200 mg of calcium and at least 600 mg of vitamin D per day.  If you are older than age 54, get at least 1,200 mg of calcium and at least 800 mg of vitamin D per day.  Smoking and excessive alcohol intake increase the risk of osteoporosis. Eat foods that are rich in calcium and vitamin D, and do weight-bearing exercises several times each week as directed by your health care provider. What should I know about how menopause affects my mental health? Depression may occur at any age, but it is more common as you become older. Common symptoms of depression  include:  Low or sad mood.  Changes in sleep patterns.  Changes in appetite or eating patterns.  Feeling an overall lack of motivation or enjoyment of activities that you previously enjoyed.  Frequent crying spells.  Talk with your health care provider if you think that you are experiencing depression. What should I know about immunizations? It is important that you get and maintain your immunizations. These include:  Tetanus, diphtheria, and pertussis (Tdap) booster vaccine.  Influenza every year before the flu season begins.  Pneumonia vaccine.  Shingles vaccine.  Your health care provider may also recommend other immunizations. This information is not intended to replace advice given to you by your health care provider. Make sure you discuss any questions you have with your health care provider. Document Released: 09/05/2005  Document Revised: 02/01/2016 Document Reviewed: 04/17/2015 Elsevier Interactive Patient Education  2018 [employer]  

## 2018-01-02 LAB — CBC WITH DIFFERENTIAL/PLATELET
BASOS ABS: 0.1 10*3/uL (ref 0.0–0.2)
Basos: 1 %
EOS (ABSOLUTE): 0.1 10*3/uL (ref 0.0–0.4)
Eos: 2 %
HEMOGLOBIN: 12.1 g/dL (ref 11.1–15.9)
Hematocrit: 38.2 % (ref 34.0–46.6)
IMMATURE GRANS (ABS): 0 10*3/uL (ref 0.0–0.1)
Immature Granulocytes: 0 %
LYMPHS ABS: 1.7 10*3/uL (ref 0.7–3.1)
Lymphs: 39 %
MCH: 27.4 pg (ref 26.6–33.0)
MCHC: 31.7 g/dL (ref 31.5–35.7)
MCV: 87 fL (ref 79–97)
MONOCYTES: 9 %
Monocytes Absolute: 0.4 10*3/uL (ref 0.1–0.9)
NEUTROS ABS: 2.1 10*3/uL (ref 1.4–7.0)
Neutrophils: 49 %
Platelets: 302 10*3/uL (ref 150–450)
RBC: 4.41 x10E6/uL (ref 3.77–5.28)
RDW: 14.3 % (ref 12.3–15.4)
WBC: 4.4 10*3/uL (ref 3.4–10.8)

## 2018-01-02 LAB — COMPREHENSIVE METABOLIC PANEL
ALBUMIN: 4.4 g/dL (ref 3.6–4.8)
ALT: 12 IU/L (ref 0–32)
AST: 16 IU/L (ref 0–40)
Albumin/Globulin Ratio: 1.6 (ref 1.2–2.2)
Alkaline Phosphatase: 105 IU/L (ref 39–117)
BUN / CREAT RATIO: 18 (ref 12–28)
BUN: 11 mg/dL (ref 8–27)
Bilirubin Total: 0.3 mg/dL (ref 0.0–1.2)
CO2: 25 mmol/L (ref 20–29)
CREATININE: 0.61 mg/dL (ref 0.57–1.00)
Calcium: 9.7 mg/dL (ref 8.7–10.3)
Chloride: 101 mmol/L (ref 96–106)
GFR calc non Af Amer: 95 mL/min/{1.73_m2} (ref >59)
GFR, EST AFRICAN AMERICAN: 109 mL/min/{1.73_m2} (ref >59)
GLOBULIN, TOTAL: 2.7 g/dL (ref 1.5–4.5)
GLUCOSE: 84 mg/dL (ref 65–99)
Potassium: 4.4 mmol/L (ref 3.5–5.2)
SODIUM: 140 mmol/L (ref 134–144)
TOTAL PROTEIN: 7.1 g/dL (ref 6.0–8.5)

## 2018-01-02 LAB — TSH: TSH: 2.76 u[IU]/mL (ref 0.450–4.500)

## 2018-01-02 LAB — LIPID PANEL
CHOLESTEROL TOTAL: 186 mg/dL (ref 100–199)
Chol/HDL Ratio: 3.2 ratio (ref 0.0–4.4)
HDL: 59 mg/dL (ref >39)
LDL Calculated: 114 mg/dL — ABNORMAL HIGH (ref 0–99)
Triglycerides: 64 mg/dL (ref 0–149)
VLDL Cholesterol Cal: 13 mg/dL (ref 5–40)

## 2018-01-02 LAB — VITAMIN D 25 HYDROXY (VIT D DEFICIENCY, FRACTURES): Vit D, 25-Hydroxy: 23.2 ng/mL — ABNORMAL LOW (ref 30.0–100.0)

## 2018-01-04 ENCOUNTER — Telehealth: Payer: Self-pay

## 2018-01-04 NOTE — Telephone Encounter (Signed)
Patient advised as below. Patient verbalizes understanding and is in agreement with treatment plan.  

## 2018-01-04 NOTE — Telephone Encounter (Signed)
-----   Message from Mar Daring, [place] sent at 01/03/2018  3:49 PM EDT ----- Labs are wnl and stable with exception of vit d that is borderline low. Would benefit from otc supplement of vit d 1000-2000 IU daily.

## 2018-01-22 ENCOUNTER — Ambulatory Visit
Admission: RE | Admit: 2018-01-22 | Discharge: 2018-01-22 | Disposition: A | Discharge status: Home / Self Care | Payer: 59 | Source: Ambulatory Visit | Attending: Physician Assistant | Admitting: Physician Assistant

## 2018-01-22 DIAGNOSIS — Z1283 Encounter for screening for malignant neoplasm of skin: Secondary | ICD-10-CM | POA: Diagnosis not present

## 2018-01-22 DIAGNOSIS — Z1231 Encounter for screening mammogram for malignant neoplasm of breast: Secondary | ICD-10-CM | POA: Insufficient documentation

## 2018-01-22 DIAGNOSIS — D1801 Hemangioma of skin and subcutaneous tissue: Secondary | ICD-10-CM | POA: Diagnosis not present

## 2018-01-22 DIAGNOSIS — D2261 Melanocytic nevi of right upper limb, including shoulder: Secondary | ICD-10-CM | POA: Diagnosis not present

## 2018-01-22 DIAGNOSIS — L578 Other skin changes due to chronic exposure to nonionizing radiation: Secondary | ICD-10-CM | POA: Diagnosis not present

## 2018-01-22 DIAGNOSIS — D485 Neoplasm of uncertain behavior of skin: Secondary | ICD-10-CM | POA: Diagnosis not present

## 2018-01-22 DIAGNOSIS — Z1239 Encounter for other screening for malignant neoplasm of breast: Secondary | ICD-10-CM

## 2018-01-22 DIAGNOSIS — L905 Scar conditions and fibrosis of skin: Secondary | ICD-10-CM | POA: Diagnosis not present

## 2018-08-04 DIAGNOSIS — Z86018 Personal history of other benign neoplasm: Secondary | ICD-10-CM | POA: Diagnosis not present

## 2018-08-04 DIAGNOSIS — Z1283 Encounter for screening for malignant neoplasm of skin: Secondary | ICD-10-CM | POA: Diagnosis not present

## 2018-08-04 DIAGNOSIS — C44612 Basal cell carcinoma of skin of right upper limb, including shoulder: Secondary | ICD-10-CM | POA: Diagnosis not present

## 2018-08-04 DIAGNOSIS — L578 Other skin changes due to chronic exposure to nonionizing radiation: Secondary | ICD-10-CM | POA: Diagnosis not present

## 2018-08-04 DIAGNOSIS — D485 Neoplasm of uncertain behavior of skin: Secondary | ICD-10-CM | POA: Diagnosis not present

## 2018-08-20 DIAGNOSIS — C44612 Basal cell carcinoma of skin of right upper limb, including shoulder: Secondary | ICD-10-CM | POA: Diagnosis not present

## 2018-09-02 DIAGNOSIS — C44612 Basal cell carcinoma of skin of right upper limb, including shoulder: Secondary | ICD-10-CM | POA: Diagnosis not present

## 2018-09-22 DIAGNOSIS — C44612 Basal cell carcinoma of skin of right upper limb, including shoulder: Secondary | ICD-10-CM | POA: Diagnosis not present

## 2019-01-20 NOTE — Progress Notes (Signed)
Patient: Lauren Sims, Female    DOB: January 28, 1951, 68 y.o.   MRN: [id number] Visit Date: 01/21/2019  Today's Provider: Mar Daring, PA-C   Chief Complaint  Patient presents with  . Annual Exam   Subjective:     Annual physical exam Lauren Sims is a 68 y.o. female who presents today for health maintenance and complete physical. She feels well. She reports exercising lightly. She reports she is sleeping well. ----------------------------------------------------------------   Review of Systems  Constitutional: Negative.   HENT: Positive for tinnitus.   Eyes: Negative.   Respiratory: Negative.   Cardiovascular: Negative.   Gastrointestinal: Negative.   Endocrine: Negative.   Genitourinary: Negative.   Musculoskeletal: Negative.   Skin: Negative.   Allergic/Immunologic: Negative.   Neurological: Positive for headaches.  Hematological: Negative.   Psychiatric/Behavioral: Negative.     Social History      She  reports that she has never smoked. She has never used smokeless tobacco. She reports current alcohol use. She reports that she does not use drugs.       Social History   Socioeconomic History  . Marital status: Married    Spouse name: Not on file  . Number of children: 2  . Years of education: Not on file  . Highest education level: Not on file  Occupational History  . Not on file  Social Needs  . Financial resource strain: Not on file  . Food insecurity    Worry: Not on file    Inability: Not on file  . Transportation needs    Medical: Not on file    Non-medical: Not on file  Tobacco Use  . Smoking status: Never Smoker  . Smokeless tobacco: Never Used  Substance and Sexual Activity  . Alcohol use: Yes    Comment: 1 glass wine every other week  . Drug use: No  . Sexual activity: Not on file  Lifestyle  . Physical activity    Days per week: Not on file    Minutes per session: Not on file  . Stress: Not on file  Relationships  .  Social Herbalist on phone: Not on file    Gets together: Not on file    Attends religious service: Not on file    Active member of club or organization: Not on file    Attends meetings of clubs or organizations: Not on file    Relationship status: Not on file  Other Topics Concern  . Not on file  Social History Narrative  . Not on file    Past Medical History:  Diagnosis Date  . Arthritis    hips/knees  . Complication of anesthesia    slow to wake  . Dental crown present    cap - top, front. crown - top,rear  . Headache   . Mitral valve prolapse    followed by PCP     Patient Active Problem List   Diagnosis Date Noted  . Osteopenia 01/09/2016  . Hypercholesterolemia without hypertriglyceridemia 12/18/2015  . Abnormal thyroid blood test 12/18/2015  . Allergic rhinitis, seasonal 09/26/2015  . Adjustment disorder with anxiety 09/26/2015  . Headache, migraine 11/21/2009  . Avitaminosis D 10/17/2008  . Bone/cartilage disorder 10/05/2007  . Bloodgood disease 10/05/2007  . Cyst of ovary 03/08/2002  . Cardiac murmur 01/24/1997    Past Surgical History:  Procedure Laterality Date  . APPENDECTOMY    . COLONOSCOPY WITH PROPOFOL N/A 04/27/2015  Procedure: COLONOSCOPY WITH PROPOFOL;  Surgeon: Lucilla Lame, MD;  Location: [facility];  Service: Endoscopy;  Laterality: N/A;  . KNEE SURGERY      Family History        Family Status  Relation Name Status  . Mother  Aloha Gell, age 47y       Healthy  . Father  Deceased at age 17       2013 heart failure  . Sister  Alive, age 64y  . Brother  72, age 51y  . Daughter  Alive  . Sister  Alive  . Sister  Deceased at age 67       Kidney Cancer  . Daughter  Alive  . Neg Hx  (Not Specified)        Her family history includes Cancer in her sister; Glaucoma in her mother; Hearing loss in her mother; Heart disease in her sister. There is no history of Breast cancer.      Allergies  Allergen Reactions  .  Codeine Nausea And Vomiting  . Shellfish Allergy Rash     Current Outpatient Medications:  .  aspirin 325 MG tablet, Take 325 mg by mouth daily as needed., Disp: , Rfl:  .  butalbital-acetaminophen-caffeine (FIORICET, ESGIC) 50-325-40 MG tablet, Take 1 tablet by mouth as needed., Disp: 30 tablet, Rfl: 5 .  escitalopram (LEXAPRO) 10 MG tablet, Take 1/2 tab PO q hs x 1 week then increase to 1 tab PO q hs, Disp: 30 tablet, Rfl: 5 .  LYSINE PO, Take 1 capsule by mouth every other day. , Disp: , Rfl:  .  Omega-3 Fatty Acids (FISH OIL) 1000 MG CAPS, Take 1 capsule by mouth daily. , Disp: , Rfl:    Patient Care Team: Mar Daring, PA-C as PCP - General (Family Medicine)    Objective:    Vitals: BP 129/78 (BP Location: Right Arm, Patient Position: Sitting, Cuff Size: Normal)   Pulse 76   Temp 97.8 F (36.6 C) (Oral)   Wt 172 lb 3.2 oz (78.1 kg)   SpO2 96%   BMI 27.79 kg/m    Vitals:   01/21/19 1446  BP: 129/78  Pulse: 76  Temp: 97.8 F (36.6 C)  TempSrc: Oral  SpO2: 96%  Weight: 172 lb 3.2 oz (78.1 kg)     Physical Exam Vitals signs reviewed.  Constitutional:      General: She is not in acute distress.    Appearance: Normal appearance. She is well-developed and normal weight. She is not ill-appearing or diaphoretic.  HENT:     Head: Normocephalic and atraumatic.     Right Ear: Tympanic membrane, ear canal and external ear normal.     Left Ear: Tympanic membrane, ear canal and external ear normal.     Nose: Nose normal.     Mouth/Throat:     Mouth: Mucous membranes are moist.     Pharynx: No oropharyngeal exudate.  Eyes:     General: No scleral icterus.       Right eye: No discharge.        Left eye: No discharge.     Extraocular Movements: Extraocular movements intact.     Conjunctiva/sclera: Conjunctivae normal.     Pupils: Pupils are equal, round, and reactive to light.  Neck:     Musculoskeletal: Normal range of motion and neck supple.     Thyroid: No  thyromegaly.     Vascular: No carotid bruit or JVD.  Trachea: No tracheal deviation.  Cardiovascular:     Rate and Rhythm: Normal rate and regular rhythm.     Pulses: Normal pulses.     Heart sounds: Normal heart sounds. No murmur. No friction rub. No gallop.   Pulmonary:     Effort: Pulmonary effort is normal. No respiratory distress.     Breath sounds: Normal breath sounds. No wheezing or rales.  Chest:     Chest wall: No tenderness.  Abdominal:     General: Bowel sounds are normal. There is no distension.     Palpations: Abdomen is soft. There is no mass.     Tenderness: There is no abdominal tenderness. There is no guarding or rebound.  Musculoskeletal: Normal range of motion.        General: No tenderness.  Lymphadenopathy:     Cervical: No cervical adenopathy.  Skin:    General: Skin is warm and dry.     Capillary Refill: Capillary refill takes less than 2 seconds.     Findings: No rash.  Neurological:     General: No focal deficit present.     Mental Status: She is alert and oriented to person, place, and time. Mental status is at baseline.     Cranial Nerves: No cranial nerve deficit.     Motor: No weakness.     Gait: Gait normal.  Psychiatric:        Mood and Affect: Mood normal.        Behavior: Behavior normal.        Thought Content: Thought content normal.        Judgment: Judgment normal.      Depression Screen PHQ 2/9 Scores 01/21/2019 01/01/2018 12/26/2016  PHQ - 2 Score 0 0 0  PHQ- 9 Score 0 2 0       Assessment & Plan:     Routine Health Maintenance and Physical Exam  Exercise Activities and Dietary recommendations Goals   None     Immunization History  Administered Date(s) Administered  . Influenza, High Dose Seasonal PF 06/29/2017  . Influenza,inj,Quad PF,6+ Mos 08/04/2018  . Pneumococcal Conjugate-13 01/01/2018  . Td 06/06/2003, 10/07/2007  . Tdap 11/21/2009  . Zoster 02/04/2012    Health Maintenance  Topic Date Due  . PNA vac  Low Risk Adult (2 of 2 - PPSV23) 01/02/2019  . INFLUENZA VACCINE  02/26/2019  . TETANUS/TDAP  11/22/2019  . MAMMOGRAM  01/23/2020  . COLONOSCOPY  04/26/2025  . DEXA SCAN  Completed  . Hepatitis C Screening  Completed     Discussed health benefits of physical activity, and encouraged her to engage in regular exercise appropriate for her age and condition.    1. Annual physical exam Normal physical exam today. Will check labs as below and f/u pending lab results. If labs are stable and WNL she will not need to have these rechecked for one year at her next annual physical exam. She is to call the office in the meantime if she has any acute issue, questions or concerns. - CBC w/Diff/Platelet - Comprehensive Metabolic Panel (CMET) - Lipid Profile  2. Breast cancer screening Breast exam today was normal. There is no family history of breast cancer. She does perform regular self breast exams. Mammogram was ordered as below. Information for [facility] was given to patient so she may schedule her mammogram at her convenience. - MM 3D SCREEN BREAST BILATERAL; Future  3. Abnormal thyroid blood test Will check labs as below  and f/u pending results. - TSH  4. Avitaminosis D Stable. Diagnosis pulled for medication refill. Continue current medical treatment plan. Will check labs as below and f/u pending results. - CBC w/Diff/Platelet - Vitamin D (25 hydroxy) - Vitamin D, Ergocalciferol, (DRISDOL) 1.25 MG (50000 UT) CAPS capsule; Take 1 capsule (50,000 Units total) by mouth every 7 (seven) days.  Dispense: 12 capsule; Refill: 0  5. Hypercholesterolemia without hypertriglyceridemia Diet controlled. Will check labs as below and f/u pending results. - Comprehensive Metabolic Panel (CMET) - Lipid Profile  6. Need for pneumococcal vaccination Pneumococcal 23 Vaccine given to patient without complications. Patient sat for 15 minutes after administration and was tolerated well without  adverse effects. - Pneumococcal polysaccharide vaccine 23-valent greater than or equal to 2yo subcutaneous/IM  7. BMI 27.0-27.9,adult Counseled patient on healthy lifestyle modifications including dieting and exercise.   --------------------------------------------------------------------    Mar Daring, PA-C  [facility]

## 2019-01-21 ENCOUNTER — Other Ambulatory Visit: Payer: Self-pay

## 2019-01-21 ENCOUNTER — Encounter: Payer: Self-pay | Admitting: Physician Assistant

## 2019-01-21 ENCOUNTER — Ambulatory Visit (INDEPENDENT_AMBULATORY_CARE_PROVIDER_SITE_OTHER): Payer: 59 | Admitting: Physician Assistant

## 2019-01-21 VITALS — BP 129/78 | HR 76 | Temp 97.8°F | Wt 172.2 lb

## 2019-01-21 DIAGNOSIS — E7800 Pure hypercholesterolemia, unspecified: Secondary | ICD-10-CM | POA: Diagnosis not present

## 2019-01-21 DIAGNOSIS — R7989 Other specified abnormal findings of blood chemistry: Secondary | ICD-10-CM | POA: Diagnosis not present

## 2019-01-21 DIAGNOSIS — Z0000 Encounter for general adult medical examination without abnormal findings: Secondary | ICD-10-CM | POA: Diagnosis not present

## 2019-01-21 DIAGNOSIS — Z1239 Encounter for other screening for malignant neoplasm of breast: Secondary | ICD-10-CM | POA: Diagnosis not present

## 2019-01-21 DIAGNOSIS — Z6827 Body mass index (BMI) 27.0-27.9, adult: Secondary | ICD-10-CM | POA: Diagnosis not present

## 2019-01-21 DIAGNOSIS — E559 Vitamin D deficiency, unspecified: Secondary | ICD-10-CM

## 2019-01-21 DIAGNOSIS — Z23 Encounter for immunization: Secondary | ICD-10-CM | POA: Diagnosis not present

## 2019-01-21 NOTE — Patient Instructions (Signed)
Health Maintenance After Age 67 After age 67, you are at a higher risk for certain long-term diseases and infections as well as injuries from falls. Falls are a major cause of broken bones and head injuries in people who are older than age 67. Getting regular preventive care can help to keep you healthy and well. Preventive care includes getting regular testing and making lifestyle changes as recommended by your health care provider. Talk with your health care provider about:  Which screenings and tests you should have. A screening is a test that checks for a disease when you have no symptoms.  A diet and exercise plan that is right for you. What should I know about screenings and tests to prevent falls? Screening and testing are the best ways to find a health problem early. Early diagnosis and treatment give you the best chance of managing medical conditions that are common after age 67. Certain conditions and lifestyle choices may make you more likely to have a fall. Your health care provider may recommend:  Regular vision checks. Poor vision and conditions such as cataracts can make you more likely to have a fall. If you wear glasses, make sure to get your prescription updated if your vision changes.  Medicine review. Work with your health care provider to regularly review all of the medicines you are taking, including over-the-counter medicines. Ask your health care provider about any side effects that may make you more likely to have a fall. Tell your health care provider if any medicines that you take make you feel dizzy or sleepy.  Osteoporosis screening. Osteoporosis is a condition that causes the bones to get weaker. This can make the bones weak and cause them to break more easily.  Blood pressure screening. Blood pressure changes and medicines to control blood pressure can make you feel dizzy.  Strength and balance checks. Your health care provider may recommend certain tests to check your  strength and balance while standing, walking, or changing positions.  Foot health exam. Foot pain and numbness, as well as not wearing proper footwear, can make you more likely to have a fall.  Depression screening. You may be more likely to have a fall if you have a fear of falling, feel emotionally low, or feel unable to do activities that you used to do.  Alcohol use screening. Using too much alcohol can affect your balance and may make you more likely to have a fall. What actions can I take to lower my risk of falls? General instructions  Talk with your health care provider about your risks for falling. Tell your health care provider if: ? You fall. Be sure to tell your health care provider about all falls, even ones that seem minor. ? You feel dizzy, sleepy, or off-balance.  Take over-the-counter and prescription medicines only as told by your health care provider. These include any supplements.  Eat a healthy diet and maintain a healthy weight. A healthy diet includes low-fat dairy products, low-fat (lean) meats, and fiber from whole grains, beans, and lots of fruits and vegetables. Home safety  Remove any tripping hazards, such as rugs, cords, and clutter.  Install safety equipment such as grab bars in bathrooms and safety rails on stairs.  Keep rooms and walkways well-lit. Activity   Follow a regular exercise program to stay fit. This will help you maintain your balance. Ask your health care provider what types of exercise are appropriate for you.  If you need a cane or   walker, use it as recommended by your health care provider.  Wear supportive shoes that have nonskid soles. Lifestyle  Do not drink alcohol if your health care provider tells you not to drink.  If you drink alcohol, limit how much you have: ? 0-1 drink a day for women. ? 0-2 drinks a day for men.  Be aware of how much alcohol is in your drink. In the [place] one drink equals one typical bottle of beer (12  oz), one-half glass of wine (5 oz), or one shot of hard liquor (1 oz).  Do not use any products that contain nicotine or tobacco, such as cigarettes and e-cigarettes. If you need help quitting, ask your health care provider. Summary  Having a healthy lifestyle and getting preventive care can help to protect your health and wellness after age 67.  Screening and testing are the best way to find a health problem early and help you avoid having a fall. Early diagnosis and treatment give you the best chance for managing medical conditions that are more common for people who are older than age 67.  Falls are a major cause of broken bones and head injuries in people who are older than age 67. Take precautions to prevent a fall at home.  Work with your health care provider to learn what changes you can make to improve your health and wellness and to prevent falls. This information is not intended to replace advice given to you by your health care provider. Make sure you discuss any questions you have with your health care provider. Document Released: 05/27/2017 Document Revised: 05/27/2017 Document Reviewed: 05/27/2017 Elsevier Interactive Patient Education  2019 [employer]vier Inc[employer]  

## 2019-01-22 LAB — COMPREHENSIVE METABOLIC PANEL
ALT: 8 IU/L (ref 0–32)
AST: 15 IU/L (ref 0–40)
Albumin/Globulin Ratio: 1.6 (ref 1.2–2.2)
Albumin: 4.4 g/dL (ref 3.8–4.8)
Alkaline Phosphatase: 109 IU/L (ref 39–117)
BUN/Creatinine Ratio: 15 (ref 12–28)
BUN: 10 mg/dL (ref 8–27)
Bilirubin Total: 0.3 mg/dL (ref 0.0–1.2)
CO2: 23 mmol/L (ref 20–29)
Calcium: 9.5 mg/dL (ref 8.7–10.3)
Chloride: 98 mmol/L (ref 96–106)
Creatinine, Ser: 0.65 mg/dL (ref 0.57–1.00)
GFR calc Af Amer: 106 mL/min/{1.73_m2} (ref >59)
GFR calc non Af Amer: 92 mL/min/{1.73_m2} (ref >59)
Globulin, Total: 2.8 g/dL (ref 1.5–4.5)
Glucose: 104 mg/dL — ABNORMAL HIGH (ref 65–99)
Potassium: 4.1 mmol/L (ref 3.5–5.2)
Sodium: 138 mmol/L (ref 134–144)
Total Protein: 7.2 g/dL (ref 6.0–8.5)

## 2019-01-22 LAB — CBC WITH DIFFERENTIAL/PLATELET
Basophils Absolute: 0.1 10*3/uL (ref 0.0–0.2)
Basos: 1 %
EOS (ABSOLUTE): 0 10*3/uL (ref 0.0–0.4)
Eos: 1 %
Hematocrit: 37.3 % (ref 34.0–46.6)
Hemoglobin: 12.4 g/dL (ref 11.1–15.9)
Immature Grans (Abs): 0 10*3/uL (ref 0.0–0.1)
Immature Granulocytes: 0 %
Lymphocytes Absolute: 1.5 10*3/uL (ref 0.7–3.1)
Lymphs: 28 %
MCH: 28.1 pg (ref 26.6–33.0)
MCHC: 33.2 g/dL (ref 31.5–35.7)
MCV: 84 fL (ref 79–97)
Monocytes Absolute: 0.4 10*3/uL (ref 0.1–0.9)
Monocytes: 9 %
Neutrophils Absolute: 3.2 10*3/uL (ref 1.4–7.0)
Neutrophils: 61 %
Platelets: 262 10*3/uL (ref 150–450)
RBC: 4.42 x10E6/uL (ref 3.77–5.28)
RDW: 13.3 % (ref 11.7–15.4)
WBC: 5.2 10*3/uL (ref 3.4–10.8)

## 2019-01-22 LAB — LIPID PANEL
Chol/HDL Ratio: 3.7 ratio (ref 0.0–4.4)
Cholesterol, Total: 190 mg/dL (ref 100–199)
HDL: 52 mg/dL (ref >39)
LDL Calculated: 118 mg/dL — ABNORMAL HIGH (ref 0–99)
Triglycerides: 99 mg/dL (ref 0–149)
VLDL Cholesterol Cal: 20 mg/dL (ref 5–40)

## 2019-01-22 LAB — VITAMIN D 25 HYDROXY (VIT D DEFICIENCY, FRACTURES): Vit D, 25-Hydroxy: 16.2 ng/mL — ABNORMAL LOW (ref 30.0–100.0)

## 2019-01-22 LAB — TSH: TSH: 2.01 u[IU]/mL (ref 0.450–4.500)

## 2019-01-25 MED ORDER — VITAMIN D (ERGOCALCIFEROL) 1.25 MG (50000 UNIT) PO CAPS: 50000.0000 [IU] | ORAL_CAPSULE | ORAL | 0 refills | Status: DC

## 2019-01-27 ENCOUNTER — Other Ambulatory Visit: Payer: Self-pay | Admitting: Physician Assistant

## 2019-01-27 DIAGNOSIS — G43709 Chronic migraine without aura, not intractable, without status migrainosus: Secondary | ICD-10-CM

## 2019-01-31 NOTE — Telephone Encounter (Signed)
Done

## 2019-03-01 ENCOUNTER — Telehealth: Payer: Self-pay

## 2019-03-01 ENCOUNTER — Ambulatory Visit
Admission: RE | Admit: 2019-03-01 | Discharge: 2019-03-01 | Disposition: A | Discharge status: Home / Self Care | Payer: 59 | Source: Ambulatory Visit | Attending: Physician Assistant | Admitting: Physician Assistant

## 2019-03-01 DIAGNOSIS — Z1231 Encounter for screening mammogram for malignant neoplasm of breast: Secondary | ICD-10-CM | POA: Diagnosis not present

## 2019-03-01 DIAGNOSIS — Z1239 Encounter for other screening for malignant neoplasm of breast: Secondary | ICD-10-CM

## 2019-03-01 NOTE — Telephone Encounter (Signed)
Patient was advised.  

## 2019-03-01 NOTE — Telephone Encounter (Signed)
-----   Message from Mar Daring, [place] sent at 03/01/2019  3:07 PM EDT ----- Normal mammogram. Repeat screening in one year.

## 2019-03-24 DIAGNOSIS — L118 Other specified acantholytic disorders: Secondary | ICD-10-CM | POA: Diagnosis not present

## 2019-03-24 DIAGNOSIS — Z85828 Personal history of other malignant neoplasm of skin: Secondary | ICD-10-CM | POA: Diagnosis not present

## 2019-03-24 DIAGNOSIS — L578 Other skin changes due to chronic exposure to nonionizing radiation: Secondary | ICD-10-CM | POA: Diagnosis not present

## 2019-03-24 DIAGNOSIS — D485 Neoplasm of uncertain behavior of skin: Secondary | ICD-10-CM | POA: Diagnosis not present

## 2019-03-24 DIAGNOSIS — Z86018 Personal history of other benign neoplasm: Secondary | ICD-10-CM | POA: Diagnosis not present

## 2019-04-18 ENCOUNTER — Other Ambulatory Visit: Payer: Self-pay

## 2019-04-18 ENCOUNTER — Telehealth: Payer: Self-pay | Admitting: Physician Assistant

## 2019-04-18 ENCOUNTER — Encounter: Payer: Self-pay | Admitting: Family Medicine

## 2019-04-18 ENCOUNTER — Ambulatory Visit (INDEPENDENT_AMBULATORY_CARE_PROVIDER_SITE_OTHER): Payer: 59 | Admitting: Family Medicine

## 2019-04-18 VITALS — Temp 96.7°F

## 2019-04-18 DIAGNOSIS — R519 Headache, unspecified: Secondary | ICD-10-CM

## 2019-04-18 DIAGNOSIS — R51 Headache: Secondary | ICD-10-CM

## 2019-04-18 DIAGNOSIS — R6889 Other general symptoms and signs: Secondary | ICD-10-CM | POA: Diagnosis not present

## 2019-04-18 DIAGNOSIS — R5383 Other fatigue: Secondary | ICD-10-CM

## 2019-04-18 DIAGNOSIS — R5381 Other malaise: Secondary | ICD-10-CM

## 2019-04-18 DIAGNOSIS — Z20822 Contact with and (suspected) exposure to covid-19: Secondary | ICD-10-CM

## 2019-04-18 MED ORDER — PROMETHAZINE HCL 12.5 MG PO TABS: 12.5000 mg | ORAL_TABLET | Freq: Three times a day (TID) | ORAL | 0 refills | Status: DC | PRN

## 2019-04-18 NOTE — Progress Notes (Signed)
Lauren Sims  MRN: [id number] DOB: 03-May-1951 Virtual Visit via Telephone Note  I connected with Lauren Sims on 04/18/19 at  2:40 PM EDT by telephone and verified that I am speaking with the correct person using two identifiers.  Location: Patient: Home Provider: Office   I discussed the limitations, risks, security and privacy concerns of performing an evaluation and management service by telephone and the availability of in person appointments. I also discussed with the patient that there may be a patient responsible charge related to this service. The patient expressed understanding and agreed to proceed.   Subjective:  HPI   The patient is a 68 year old female who presents for evaluation of headaches, chills and nausea.  She began having symptoms about 5 days ago.  She denies any fever.  She denies any known Covid exposure.  Patient Active Problem List   Diagnosis Date Noted  . Osteopenia 01/09/2016  . Hypercholesterolemia without hypertriglyceridemia 12/18/2015  . Abnormal thyroid blood test 12/18/2015  . Allergic rhinitis, seasonal 09/26/2015  . Adjustment disorder with anxiety 09/26/2015  . Headache, migraine 11/21/2009  . Avitaminosis D 10/17/2008  . Bone/cartilage disorder 10/05/2007  . Bloodgood disease 10/05/2007  . Cyst of ovary 03/08/2002  . Cardiac murmur 01/24/1997   Past Medical History:  Diagnosis Date  . Arthritis    hips/knees  . Complication of anesthesia    slow to wake  . Dental crown present    cap - top, front. crown - top,rear  . Headache   . Mitral valve prolapse    followed by PCP   Past Surgical History:  Procedure Laterality Date  . APPENDECTOMY    . COLONOSCOPY WITH PROPOFOL N/A 04/27/2015   Procedure: COLONOSCOPY WITH PROPOFOL;  Surgeon: Lucilla Lame, MD;  Location: [facility];  Service: Endoscopy;  Laterality: N/A;  . KNEE SURGERY     Family History  Problem Relation Age of Onset  . Glaucoma Mother   . Hearing loss  Mother   . Heart disease Sister   . Cancer Sister   . Breast cancer Sister    Social History   Socioeconomic History  . Marital status: Married    Spouse name: Not on file  . Number of children: 2  . Years of education: Not on file  . Highest education level: Not on file  Occupational History  . Not on file  Social Needs  . Financial resource strain: Not on file  . Food insecurity    Worry: Not on file    Inability: Not on file  . Transportation needs    Medical: Not on file    Non-medical: Not on file  Tobacco Use  . Smoking status: Never Smoker  . Smokeless tobacco: Never Used  Substance and Sexual Activity  . Alcohol use: Yes    Comment: 1 glass wine every other week  . Drug use: No  . Sexual activity: Not on file  Lifestyle  . Physical activity    Days per week: Not on file    Minutes per session: Not on file  . Stress: Not on file  Relationships  . Social Herbalist on phone: Not on file    Gets together: Not on file    Attends religious service: Not on file    Active member of club or organization: Not on file    Attends meetings of clubs or organizations: Not on file    Relationship status: Not on  file  . Intimate partner violence    Fear of current or ex partner: Not on file    Emotionally abused: Not on file    Physically abused: Not on file    Forced sexual activity: Not on file  Other Topics Concern  . Not on file  Social History Narrative  . Not on file   Outpatient Encounter Medications as of 04/18/2019  Medication Sig  . aspirin 325 MG tablet Take 325 mg by mouth daily as needed.  . butalbital-acetaminophen-caffeine (FIORICET) 50-325-40 MG tablet TAKE 1 TABLET BY MOUTH AS NEEDED.  Marland Kitchen escitalopram (LEXAPRO) 10 MG tablet Take 1/2 tab PO q hs x 1 week then increase to 1 tab PO q hs  . LYSINE PO Take 1 capsule by mouth every other day.   . Omega-3 Fatty Acids (FISH OIL) 1000 MG CAPS Take 1 capsule by mouth daily.   . Vitamin D,  Ergocalciferol, (DRISDOL) 1.25 MG (50000 UT) CAPS capsule Take 1 capsule (50,000 Units total) by mouth every 7 (seven) days.   No facility-administered encounter medications on file as of 04/18/2019.    Allergies  Allergen Reactions  . Codeine Nausea And Vomiting  . Shellfish Allergy Rash   Review of Systems  Constitutional: Positive for chills, diaphoresis and malaise/fatigue. Negative for fever.  HENT: Positive for nosebleeds, sore throat (scratchy and hoarse) and tinnitus. Negative for congestion, ear discharge, ear pain, hearing loss and sinus pain.   Eyes: Negative for blurred vision, double vision, pain and discharge.  Respiratory: Positive for cough. Negative for sputum production, shortness of breath and wheezing.   Cardiovascular: Negative for chest pain, palpitations and leg swelling.  Neurological: Positive for headaches. Negative for dizziness.    Objective:  There were no vitals taken for this visit.  Physical Exam: No acute respiratory distress apparent during telephonic interview.  Assessment and Plan :  1. Nonintractable headache, unspecified chronicity pattern, unspecified headache type Generalized headache with chills and nausea since 04-14-19. Temperature 96.7 today. No vomiting or diarrhea. Feels using Aleve helps decrease headache. Not as bad as past migraines but needs something for the nausea. Will give promethazine tablets for nausea and proceed with COVID test. - promethazine (PHENERGAN) 12.5 MG tablet; Take 1 tablet (12.5 mg total) by mouth every 8 (eight) hours as needed for nausea or vomiting.  Dispense: 20 tablet; Refill: 0  2. Malaise and fatigue Onset over the past 5 days with nausea and headache. Increase fluid intake and may use Aleve prn.  3. Suspected Covid-19 Virus Infection Having nausea, scratchy throat, chills, headache with low energy over the past 5 days. Had some shortness after getting hot on 04-16-19. No significant cough or specific exposure to  anyone positive for COVID-19. Recommend COVID testing and proceed with pandemic restrictions (isolation at home until test results available, wash hands frequently, wear mask if others in her room, etc.) May use OTC meds for symptoms and will schedule for monitoring at home. - Novel Coronavirus, NAA ([facility]) - Temperature monitoring; Future  Follow Up Instructions:  I discussed the assessment and treatment plan with the patient. The patient was provided an opportunity to ask questions and all were answered. The patient agreed with the plan and demonstrated an understanding of the instructions.   The patient was advised to call back or seek an in-person evaluation if the symptoms worsen or if the condition fails to improve as anticipated.  I provided 15 minutes of non-face-to-face time during this encounter.   Vernie Murders,  PA

## 2019-04-19 ENCOUNTER — Other Ambulatory Visit: Payer: Self-pay

## 2019-04-19 DIAGNOSIS — Z20822 Contact with and (suspected) exposure to covid-19: Secondary | ICD-10-CM

## 2019-04-19 DIAGNOSIS — R6889 Other general symptoms and signs: Secondary | ICD-10-CM | POA: Diagnosis not present

## 2019-04-20 LAB — NOVEL CORONAVIRUS, NAA: SARS-CoV-2, NAA: NOT DETECTED

## 2019-05-25 ENCOUNTER — Other Ambulatory Visit: Payer: Self-pay | Admitting: Oncology

## 2019-06-01 DIAGNOSIS — Z23 Encounter for immunization: Secondary | ICD-10-CM | POA: Diagnosis not present

## 2019-06-01 DIAGNOSIS — Z20828 Contact with and (suspected) exposure to other viral communicable diseases: Secondary | ICD-10-CM | POA: Diagnosis not present

## 2019-08-24 ENCOUNTER — Ambulatory Visit: Payer: 59

## 2019-08-24 DIAGNOSIS — Z23 Encounter for immunization: Secondary | ICD-10-CM | POA: Diagnosis not present

## 2019-09-02 ENCOUNTER — Ambulatory Visit: Payer: 59

## 2019-09-19 DIAGNOSIS — Z23 Encounter for immunization: Secondary | ICD-10-CM | POA: Diagnosis not present

## 2020-01-23 ENCOUNTER — Encounter: Payer: Self-pay | Admitting: Physician Assistant

## 2020-01-23 ENCOUNTER — Other Ambulatory Visit: Payer: Self-pay

## 2020-01-23 ENCOUNTER — Other Ambulatory Visit: Payer: Self-pay | Admitting: Physician Assistant

## 2020-01-23 ENCOUNTER — Ambulatory Visit (INDEPENDENT_AMBULATORY_CARE_PROVIDER_SITE_OTHER): Payer: 59 | Admitting: Physician Assistant

## 2020-01-23 VITALS — BP 148/83 | HR 68 | Temp 97.0°F | Resp 16 | Ht 65.0 in | Wt 172.2 lb

## 2020-01-23 DIAGNOSIS — N6011 Diffuse cystic mastopathy of right breast: Secondary | ICD-10-CM

## 2020-01-23 DIAGNOSIS — E559 Vitamin D deficiency, unspecified: Secondary | ICD-10-CM | POA: Diagnosis not present

## 2020-01-23 DIAGNOSIS — R7989 Other specified abnormal findings of blood chemistry: Secondary | ICD-10-CM

## 2020-01-23 DIAGNOSIS — Z1231 Encounter for screening mammogram for malignant neoplasm of breast: Secondary | ICD-10-CM | POA: Diagnosis not present

## 2020-01-23 DIAGNOSIS — M8589 Other specified disorders of bone density and structure, multiple sites: Secondary | ICD-10-CM | POA: Diagnosis not present

## 2020-01-23 DIAGNOSIS — F4323 Adjustment disorder with mixed anxiety and depressed mood: Secondary | ICD-10-CM

## 2020-01-23 DIAGNOSIS — G43709 Chronic migraine without aura, not intractable, without status migrainosus: Secondary | ICD-10-CM

## 2020-01-23 DIAGNOSIS — E7800 Pure hypercholesterolemia, unspecified: Secondary | ICD-10-CM

## 2020-01-23 DIAGNOSIS — Z0000 Encounter for general adult medical examination without abnormal findings: Secondary | ICD-10-CM | POA: Diagnosis not present

## 2020-01-23 DIAGNOSIS — Z803 Family history of malignant neoplasm of breast: Secondary | ICD-10-CM

## 2020-01-23 DIAGNOSIS — N6012 Diffuse cystic mastopathy of left breast: Secondary | ICD-10-CM

## 2020-01-23 MED ORDER — ESCITALOPRAM OXALATE 10 MG PO TABS: 10.0000 mg | ORAL_TABLET | Freq: Every day | ORAL | 1 refills | Status: DC

## 2020-01-23 MED ORDER — BUTALBITAL-APAP-CAFFEINE 50-325-40 MG PO TABS: 1.0000 | ORAL_TABLET | ORAL | 5 refills | Status: DC | PRN

## 2020-01-23 NOTE — Progress Notes (Signed)
Complete physical exam   Patient: Lauren Sims   DOB: Aug 23, 1950   69 y.o. Female  MRN: [id number] Visit Date: 01/23/2020  Today's healthcare provider: Mar Daring, PA-C   Chief Complaint  Patient presents with  . Annual Exam   Subjective    Lauren Sims is a 69 y.o. female who presents today for a complete physical exam.  She reports consuming a general diet. She walks about two to three times a week.  She generally feels well. She reports sleeping well. She does have additional problems to discuss today.  HPI  Anxiety: She reports that she is taking care of her mother. Reports that she used to take Lexapro 10mg  and tolerated well. Interested in possibly restarting.  Past Medical History:  Diagnosis Date  . Arthritis    hips/knees  . Complication of anesthesia    slow to wake  . Dental crown present    cap - top, front. crown - top,rear  . Headache   . Mitral valve prolapse    followed by PCP   Past Surgical History:  Procedure Laterality Date  . APPENDECTOMY    . COLONOSCOPY WITH PROPOFOL N/A 04/27/2015   Procedure: COLONOSCOPY WITH PROPOFOL;  Surgeon: Lucilla Lame, MD;  Location: [facility];  Service: Endoscopy;  Laterality: N/A;  . KNEE SURGERY     Social History   Socioeconomic History  . Marital status: Married    Spouse name: Not on file  . Number of children: 2  . Years of education: Not on file  . Highest education level: Not on file  Occupational History  . Not on file  Tobacco Use  . Smoking status: Never Smoker  . Smokeless tobacco: Never Used  Vaping Use  . Vaping Use: Never used  Substance and Sexual Activity  . Alcohol use: Yes    Comment: 1 glass wine every other week  . Drug use: No  . Sexual activity: Not on file  Other Topics Concern  . Not on file  Social History Narrative  . Not on file   Social Determinants of Health   Financial Resource Strain:   . Difficulty of Paying Living Expenses:   Food  Insecurity:   . Worried About Charity fundraiser in the Last Year:   . Arboriculturist in the Last Year:   Transportation Needs:   . Film/video editor (Medical):   Marland Kitchen Lack of Transportation (Non-Medical):   Physical Activity:   . Days of Exercise per Week:   . Minutes of Exercise per Session:   Stress:   . Feeling of Stress :   Social Connections:   . Frequency of Communication with Friends and Family:   . Frequency of Social Gatherings with Friends and Family:   . Attends Religious Services:   . Active Member of Clubs or Organizations:   . Attends Archivist Meetings:   Marland Kitchen Marital Status:   Intimate Partner Violence:   . Fear of Current or Ex-Partner:   . Emotionally Abused:   Marland Kitchen Physically Abused:   . Sexually Abused:    Family Status  Relation Name Status  . Mother  Lauren Sims, age 53y       Healthy  . Father  Deceased at age 23       2013 heart failure  . Sister  Lauren Sims, age 28y  . Brother  Alive, age 63y  . Daughter  Alive  . Sister Joseph Art  Alive  . Sister  Deceased at age 60       Kidney Cancer  . Daughter  Alive   Family History  Problem Relation Age of Onset  . Glaucoma Mother   . Hearing loss Mother   . Heart disease Sister   . Cancer Sister        Breast cancer dx last year 2020  . Cancer Sister   . Breast cancer Sister    Allergies  Allergen Reactions  . Codeine Nausea And Vomiting  . Shellfish Allergy Rash    Patient Care Team: Mar Daring, PA-C as PCP - General (Family Medicine)   Medications: Outpatient Medications Prior to Visit  Medication Sig  . aspirin 325 MG tablet Take 325 mg by mouth daily as needed.  Marland Kitchen LYSINE PO Take 1 capsule by mouth every other day.   . Omega-3 Fatty Acids (FISH OIL) 1000 MG CAPS Take 1 capsule by mouth daily.   . [DISCONTINUED] butalbital-acetaminophen-caffeine (FIORICET) 50-325-40 MG tablet TAKE 1 TABLET BY MOUTH AS NEEDED.  . [DISCONTINUED] promethazine (PHENERGAN) 12.5 MG tablet Take 1 tablet  (12.5 mg total) by mouth every 8 (eight) hours as needed for nausea or vomiting. (Patient not taking: Reported on 01/23/2020)  . [DISCONTINUED] Vitamin D, Ergocalciferol, (DRISDOL) 1.25 MG (50000 UT) CAPS capsule Take 1 capsule (50,000 Units total) by mouth every 7 (seven) days. (Patient not taking: Reported on 01/23/2020)   No facility-administered medications prior to visit.    Review of Systems  Constitutional: Negative.   HENT: Positive for nosebleeds and tinnitus.   Eyes: Negative.   Respiratory: Negative.   Cardiovascular: Negative.   Gastrointestinal: Negative.   Endocrine: Negative.   Genitourinary: Negative.   Musculoskeletal: Negative.   Skin: Negative.   Allergic/Immunologic: Negative.   Neurological: Negative.   Hematological: Negative.   Psychiatric/Behavioral: Positive for dysphoric mood. The patient is nervous/anxious.     Last CBC Lab Results  Component Value Date   WBC 5.0 01/23/2020   HGB 12.6 01/23/2020   HCT 38.4 01/23/2020   MCV 88 01/23/2020   MCH 28.8 01/23/2020   RDW 13.1 01/23/2020   PLT 243 30/86/5784   Last metabolic panel Lab Results  Component Value Date   GLUCOSE 98 01/23/2020   NA 139 01/23/2020   K 4.0 01/23/2020   CL 102 01/23/2020   CO2 23 01/23/2020   BUN 16 01/23/2020   CREATININE 0.62 01/23/2020   GFRNONAA 93 01/23/2020   GFRAA 107 01/23/2020   CALCIUM 9.5 01/23/2020   PROT 7.1 01/23/2020   ALBUMIN 4.2 01/23/2020   LABGLOB 2.9 01/23/2020   AGRATIO 1.4 01/23/2020   BILITOT 0.2 01/23/2020   ALKPHOS 120 01/23/2020   AST 18 01/23/2020   ALT 14 01/23/2020   Last lipids Lab Results  Component Value Date   CHOL 216 (H) 01/23/2020   HDL 52 01/23/2020   LDLCALC 132 (H) 01/23/2020   TRIG 179 (H) 01/23/2020   CHOLHDL 4.2 01/23/2020      Objective    BP (!) 148/83 (BP Location: Left Arm, Patient Position: Sitting, Cuff Size: Normal)   Pulse 68   Temp (!) 97 F (36.1 C) (Temporal)   Resp 16   Ht 5\' 5"  (1.651 m)   Wt 172  lb 3.2 oz (78.1 kg)   BMI 28.66 kg/m  BP Readings from Last 3 Encounters:  01/23/20 (!) 148/83  01/21/19 129/78  01/01/18 (!) 142/80   Wt Readings from Last 3 Encounters:  01/23/20 172  lb 3.2 oz (78.1 kg)  01/21/19 172 lb 3.2 oz (78.1 kg)  01/01/18 175 lb (79.4 kg)      Physical Exam Vitals reviewed.  Constitutional:      General: She is not in acute distress.    Appearance: Normal appearance. She is obese. She is not ill-appearing.  HENT:     Head: Normocephalic and atraumatic.  Eyes:     General: No scleral icterus. Musculoskeletal:     Cervical back: Normal range of motion and neck supple.  Neurological:     Mental Status: She is alert and oriented to person, place, and time.  Psychiatric:        Mood and Affect: Mood normal.        Behavior: Behavior normal.        Thought Content: Thought content normal.        Judgment: Judgment normal.      Last depression screening scores PHQ 2/9 Scores 01/23/2020 01/21/2019 01/01/2018  PHQ - 2 Score 0 0 0  PHQ- 9 Score - 0 2   Last fall risk screening Fall Risk  01/23/2020  Falls in the past year? 0  Number falls in past yr: 0  Injury with Fall? 0  Risk for fall due to : No Fall Risks  Follow up Falls evaluation completed   Last Audit-C alcohol use screening Alcohol Use Disorder Test (AUDIT) 01/23/2020  1. How often do you have a drink containing alcohol? 0  2. How many drinks containing alcohol do you have on a typical day when you are drinking? 0  3. How often do you have six or more drinks on one occasion? 0  AUDIT-C Score 0  Alcohol Brief Interventions/Follow-up AUDIT Score <7 follow-up not indicated   A score of 3 or more in women, and 4 or more in men indicates increased risk for alcohol abuse, EXCEPT if all of the points are from question 1   Results for orders placed or performed in visit on 01/23/20  CBC with Differential/Platelet  Result Value Ref Range   WBC 5.0 3.4 - 10.8 x10E3/uL   RBC 4.38 3.77 - 5.28  x10E6/uL   Hemoglobin 12.6 11.1 - 15.9 g/dL   Hematocrit 38.4 34.0 - 46.6 %   MCV 88 79 - 97 fL   MCH 28.8 26.6 - 33.0 pg   MCHC 32.8 31 - 35 g/dL   RDW 13.1 11.7 - 15.4 %   Platelets 243 150 - 450 x10E3/uL   Neutrophils 59 Not Estab. %   Lymphs 30 Not Estab. %   Monocytes 9 Not Estab. %   Eos 1 Not Estab. %   Basos 1 Not Estab. %   Neutrophils Absolute 2.9 1 - 7 x10E3/uL   Lymphocytes Absolute 1.5 0 - 3 x10E3/uL   Monocytes Absolute 0.5 0 - 0 x10E3/uL   EOS (ABSOLUTE) 0.1 0.0 - 0.4 x10E3/uL   Basophils Absolute 0.1 0 - 0 x10E3/uL   Immature Granulocytes 0 Not Estab. %   Immature Grans (Abs) 0.0 0.0 - 0.1 x10E3/uL  Comprehensive metabolic panel  Result Value Ref Range   Glucose 98 65 - 99 mg/dL   BUN 16 8 - 27 mg/dL   Creatinine, Ser 0.62 0.57 - 1.00 mg/dL   GFR calc non Af Amer 93 >59 mL/min/1.73   GFR calc Af Amer 107 >59 mL/min/1.73   BUN/Creatinine Ratio 26 12 - 28   Sodium 139 134 - 144 mmol/L   Potassium 4.0 3.5 -  5.2 mmol/L   Chloride 102 96 - 106 mmol/L   CO2 23 20 - 29 mmol/L   Calcium 9.5 8.7 - 10.3 mg/dL   Total Protein 7.1 6.0 - 8.5 g/dL   Albumin 4.2 3.8 - 4.8 g/dL   Globulin, Total 2.9 1.5 - 4.5 g/dL   Albumin/Globulin Ratio 1.4 1.2 - 2.2   Bilirubin Total 0.2 0.0 - 1.2 mg/dL   Alkaline Phosphatase 120 48 - 121 IU/L   AST 18 0 - 40 IU/L   ALT 14 0 - 32 IU/L  Hemoglobin A1c  Result Value Ref Range   Hgb A1c MFr Bld 5.7 (H) 4.8 - 5.6 %   Est. average glucose Bld gHb Est-mCnc 117 mg/dL  Lipid panel  Result Value Ref Range   Cholesterol, Total 216 (H) 100 - 199 mg/dL   Triglycerides 179 (H) 0 - 149 mg/dL   HDL 52 >39 mg/dL   VLDL Cholesterol Cal 32 5 - 40 mg/dL   LDL Chol Calc (NIH) 132 (H) 0 - 99 mg/dL   Chol/HDL Ratio 4.2 0.0 - 4.4 ratio  TSH  Result Value Ref Range   TSH 3.690 0.450 - 4.500 uIU/mL  Vitamin D (25 hydroxy)  Result Value Ref Range   Vit D, 25-Hydroxy 18.5 (L) 30.0 - 100.0 ng/mL    Assessment & Plan    Routine Health Maintenance  and Physical Exam  Exercise Activities and Dietary recommendations Goals   None     Immunization History  Administered Date(s) Administered  . Influenza, High Dose Seasonal PF 06/29/2017  . Influenza,inj,Quad PF,6+ Mos 08/04/2018  . PFIZER SARS-COV-2 Vaccination 08/24/2019, 09/19/2019  . Pneumococcal Conjugate-13 01/01/2018  . Pneumococcal Polysaccharide-23 01/21/2019  . Td 06/06/2003, 10/07/2007  . Tdap 11/21/2009  . Zoster 02/04/2012    Health Maintenance  Topic Date Due  . TETANUS/TDAP  01/22/2021 (Originally 11/22/2019)  . INFLUENZA VACCINE  02/26/2020  . MAMMOGRAM  02/29/2020  . DEXA SCAN  01/07/2021  . COLONOSCOPY  04/26/2025  . COVID-19 Vaccine  Completed  . Hepatitis C Screening  Completed  . PNA vac Low Risk Adult  Completed    Discussed health benefits of physical activity, and encouraged her to engage in regular exercise appropriate for her age and condition.  1. Annual physical exam Normal physical exam today. Will check labs as below and f/u pending lab results. If labs are stable and WNL she will not need to have these rechecked for one year at her next annual physical exam. She is to call the office in the meantime if she has any acute issue, questions or concerns.  2. Encounter for screening mammogram for breast cancer Breast exam today was normal. There is family history of breast cancer in her sister. She does perform regular self breast exams. Mammogram was ordered as below. Information for [facility] was given to patient so she may schedule her mammogram at her convenience. - MM 3D SCREEN BREAST BILATERAL; Future  3. Hypercholesterolemia without hypertriglyceridemia Diet controlled. Will check labs as below and f/u pending results. - CBC with Differential/Platelet - Comprehensive metabolic panel - Hemoglobin A1c - Lipid panel  4. Situational mixed anxiety and depressive disorder Worsening with being primary caregiver for her mother.  Restart escitalopram as below. Will check labs as below and f/u pending results. - CBC with Differential/Platelet - Comprehensive metabolic panel - TSH - escitalopram (LEXAPRO) 10 MG tablet; Take 1 tablet (10 mg total) by mouth daily.  Dispense: 90 tablet; Refill: 1  5. Avitaminosis D H/O this and postmenopausal. Will check labs as below and f/u pending results. - CBC with Differential/Platelet - Comprehensive metabolic panel - Vitamin D (25 hydroxy) - Vitamin D, Ergocalciferol, (DRISDOL) 1.25 MG (50000 UNIT) CAPS capsule; Take 1 capsule (50,000 Units total) by mouth every 7 (seven) days.  Dispense: 12 capsule; Refill: 1  6. Abnormal thyroid blood test H/O this. Will check labs as below and f/u pending results. - CBC with Differential/Platelet - Comprehensive metabolic panel - TSH  7. Fibrocystic breast changes of both breasts History of this on mammograms and with positive family history will get annual mammograms.   8. Osteopenia of multiple sites Will check labs as below and f/u pending results. Bone density due next year.  - Vitamin D (25 hydroxy)  9. Chronic migraine without aura without status migrainosus, not intractable Stable. Diagnosis pulled for medication refill. Continue current medical treatment plan. - butalbital-acetaminophen-caffeine (FIORICET) 50-325-40 MG tablet; Take 1 tablet by mouth as needed.  Dispense: 30 tablet; Refill: 5   Return in about 1 year (around 01/22/2021).     Reynolds Bowl, PA-C, have reviewed all documentation for this visit. The documentation on 01/24/20 for the exam, diagnosis, procedures, and orders are all accurate and complete.    Rubye Beach  [facility] [phone] (phone) [phone] (fax)  [place]

## 2020-01-23 NOTE — Patient Instructions (Addendum)
[facility] at [place],  [place]  [address] [address]: [phone]  Check BP and if >140/90    Health Maintenance After Age 68 After age 4, you are at a higher risk for certain long-term diseases and infections as well as injuries from falls. Falls are a major cause of broken bones and head injuries in people who are older than age 12. Getting regular preventive care can help to keep you healthy and well. Preventive care includes getting regular testing and making lifestyle changes as recommended by your health care provider. Talk with your health care provider about:  Which screenings and tests you should have. A screening is a test that checks for a disease when you have no symptoms.  A diet and exercise plan that is right for you. What should I know about screenings and tests to prevent falls? Screening and testing are the best ways to find a health problem early. Early diagnosis and treatment give you the best chance of managing medical conditions that are common after age 41. Certain conditions and lifestyle choices may make you more likely to have a fall. Your health care provider may recommend:  Regular vision checks. Poor vision and conditions such as cataracts can make you more likely to have a fall. If you wear glasses, make sure to get your prescription updated if your vision changes.  Medicine review. Work with your health care provider to regularly review all of the medicines you are taking, including over-the-counter medicines. Ask your health care provider about any side effects that may make you more likely to have a fall. Tell your health care provider if any medicines that you take make you feel dizzy or sleepy.  Osteoporosis screening. Osteoporosis is a condition that causes the bones to get weaker. This can make the bones weak and cause them to break more easily.  Blood pressure screening. Blood pressure changes and medicines to  control blood pressure can make you feel dizzy.  Strength and balance checks. Your health care provider may recommend certain tests to check your strength and balance while standing, walking, or changing positions.  Foot health exam. Foot pain and numbness, as well as not wearing proper footwear, can make you more likely to have a fall.  Depression screening. You may be more likely to have a fall if you have a fear of falling, feel emotionally low, or feel unable to do activities that you used to do.  Alcohol use screening. Using too much alcohol can affect your balance and may make you more likely to have a fall. What actions can I take to lower my risk of falls? General instructions  Talk with your health care provider about your risks for falling. Tell your health care provider if: ? You fall. Be sure to tell your health care provider about all falls, even ones that seem minor. ? You feel dizzy, sleepy, or off-balance.  Take over-the-counter and prescription medicines only as told by your health care provider. These include any supplements.  Eat a healthy diet and maintain a healthy weight. A healthy diet includes low-fat dairy products, low-fat (lean) meats, and fiber from whole grains, beans, and lots of fruits and vegetables. Home safety  Remove any tripping hazards, such as rugs, cords, and clutter.  Install safety equipment such as grab bars in bathrooms and safety rails on stairs.  Keep rooms and walkways well-lit. Activity   Follow a regular exercise program to stay fit.  This will help you maintain your balance. Ask your health care provider what types of exercise are appropriate for you.  If you need a cane or walker, use it as recommended by your health care provider.  Wear supportive shoes that have nonskid soles. Lifestyle  Do not drink alcohol if your health care provider tells you not to drink.  If you drink alcohol, limit how much you have: ? 0-1 drink a day  for women. ? 0-2 drinks a day for men.  Be aware of how much alcohol is in your drink. In the [place] one drink equals one typical bottle of beer (12 oz), one-half glass of wine (5 oz), or one shot of hard liquor (1 oz).  Do not use any products that contain nicotine or tobacco, such as cigarettes and e-cigarettes. If you need help quitting, ask your health care provider. Summary  Having a healthy lifestyle and getting preventive care can help to protect your health and wellness after age 69.  Screening and testing are the best way to find a health problem early and help you avoid having a fall. Early diagnosis and treatment give you the best chance for managing medical conditions that are more common for people who are older than age 69.  Falls are a major cause of broken bones and head injuries in people who are older than age 35. Take precautions to prevent a fall at home.  Work with your health care provider to learn what changes you can make to improve your health and wellness and to prevent falls. This information is not intended to replace advice given to you by your health care provider. Make sure you discuss any questions you have with your health care provider. Document Revised: 11/04/2018 Document Reviewed: 05/27/2017 Elsevier Patient Education  2020 [employer].

## 2020-01-24 DIAGNOSIS — Z803 Family history of malignant neoplasm of breast: Secondary | ICD-10-CM | POA: Insufficient documentation

## 2020-01-24 LAB — COMPREHENSIVE METABOLIC PANEL
ALT: 14 IU/L (ref 0–32)
AST: 18 IU/L (ref 0–40)
Albumin/Globulin Ratio: 1.4 (ref 1.2–2.2)
Albumin: 4.2 g/dL (ref 3.8–4.8)
Alkaline Phosphatase: 120 IU/L (ref 48–121)
BUN/Creatinine Ratio: 26 (ref 12–28)
BUN: 16 mg/dL (ref 8–27)
Bilirubin Total: 0.2 mg/dL (ref 0.0–1.2)
CO2: 23 mmol/L (ref 20–29)
Calcium: 9.5 mg/dL (ref 8.7–10.3)
Chloride: 102 mmol/L (ref 96–106)
Creatinine, Ser: 0.62 mg/dL (ref 0.57–1.00)
GFR calc Af Amer: 107 mL/min/{1.73_m2} (ref >59)
GFR calc non Af Amer: 93 mL/min/{1.73_m2} (ref >59)
Globulin, Total: 2.9 g/dL (ref 1.5–4.5)
Glucose: 98 mg/dL (ref 65–99)
Potassium: 4 mmol/L (ref 3.5–5.2)
Sodium: 139 mmol/L (ref 134–144)
Total Protein: 7.1 g/dL (ref 6.0–8.5)

## 2020-01-24 LAB — CBC WITH DIFFERENTIAL/PLATELET
Basophils Absolute: 0.1 10*3/uL (ref 0.0–0.2)
Basos: 1 %
EOS (ABSOLUTE): 0.1 10*3/uL (ref 0.0–0.4)
Eos: 1 %
Hematocrit: 38.4 % (ref 34.0–46.6)
Hemoglobin: 12.6 g/dL (ref 11.1–15.9)
Immature Grans (Abs): 0 10*3/uL (ref 0.0–0.1)
Immature Granulocytes: 0 %
Lymphocytes Absolute: 1.5 10*3/uL (ref 0.7–3.1)
Lymphs: 30 %
MCH: 28.8 pg (ref 26.6–33.0)
MCHC: 32.8 g/dL (ref 31.5–35.7)
MCV: 88 fL (ref 79–97)
Monocytes Absolute: 0.5 10*3/uL (ref 0.1–0.9)
Monocytes: 9 %
Neutrophils Absolute: 2.9 10*3/uL (ref 1.4–7.0)
Neutrophils: 59 %
Platelets: 243 10*3/uL (ref 150–450)
RBC: 4.38 x10E6/uL (ref 3.77–5.28)
RDW: 13.1 % (ref 11.7–15.4)
WBC: 5 10*3/uL (ref 3.4–10.8)

## 2020-01-24 LAB — LIPID PANEL
Chol/HDL Ratio: 4.2 ratio (ref 0.0–4.4)
Cholesterol, Total: 216 mg/dL — ABNORMAL HIGH (ref 100–199)
HDL: 52 mg/dL (ref >39)
LDL Chol Calc (NIH): 132 mg/dL — ABNORMAL HIGH (ref 0–99)
Triglycerides: 179 mg/dL — ABNORMAL HIGH (ref 0–149)
VLDL Cholesterol Cal: 32 mg/dL (ref 5–40)

## 2020-01-24 LAB — HEMOGLOBIN A1C
Est. average glucose Bld gHb Est-mCnc: 117 mg/dL
Hgb A1c MFr Bld: 5.7 % — ABNORMAL HIGH (ref 4.8–5.6)

## 2020-01-24 LAB — TSH: TSH: 3.69 u[IU]/mL (ref 0.450–4.500)

## 2020-01-24 LAB — VITAMIN D 25 HYDROXY (VIT D DEFICIENCY, FRACTURES): Vit D, 25-Hydroxy: 18.5 ng/mL — ABNORMAL LOW (ref 30.0–100.0)

## 2020-01-24 MED ORDER — VITAMIN D (ERGOCALCIFEROL) 1.25 MG (50000 UNIT) PO CAPS: 50000.0000 [IU] | ORAL_CAPSULE | ORAL | 1 refills | Status: DC

## 2020-01-24 NOTE — Addendum Note (Signed)
Addended by: Mar Daring on: 01/24/2020 01:46 PM   Modules accepted: Level of Service

## 2020-01-25 ENCOUNTER — Telehealth: Payer: Self-pay

## 2020-01-25 ENCOUNTER — Other Ambulatory Visit: Payer: Self-pay | Admitting: Physician Assistant

## 2020-01-25 DIAGNOSIS — E7800 Pure hypercholesterolemia, unspecified: Secondary | ICD-10-CM

## 2020-01-25 MED ORDER — ATORVASTATIN CALCIUM 10 MG PO TABS: 10.0000 mg | ORAL_TABLET | Freq: Every day | ORAL | 3 refills | Status: DC

## 2020-01-25 NOTE — Telephone Encounter (Signed)
Atorvastatin sent in. Recheck cholesterol in 6 months.

## 2020-01-25 NOTE — Telephone Encounter (Signed)
Pt advised.   Thanks,   -Kinisha Soper  

## 2020-01-25 NOTE — Telephone Encounter (Signed)
Pt advised.  She agreed to start a cholesterol medicine.  Please send to [facility].  When will she need recheck her cholesterol?  Thanks,   -Mickel Baas

## 2020-01-25 NOTE — Telephone Encounter (Signed)
-----   Message from Mar Daring, [place] sent at 01/24/2020 12:27 PM EDT ----- Blood count is normal. Kidney and liver function are normal. Sodium, potassium and calcium are normal. Sugar/A1c are normal. A1c is stable at 5.7. Cholesterol has increased compared to last year. Currently your risk of having a cardiovascular event over the next 10 years is elevated at a 10.5% chance. With adding a medication to help lower your cholesterol this risk can be lowered to 5.5%. If agreeable to start I will send in. Thyroid is normal. Vit D is low. I do think you would benefit from the once weekly Vit D supplements again. Once you complete then transition to an OTC Vit D supplement of 2000 IU daily.

## 2020-03-01 ENCOUNTER — Other Ambulatory Visit: Payer: Self-pay

## 2020-03-01 ENCOUNTER — Ambulatory Visit
Admission: RE | Admit: 2020-03-01 | Discharge: 2020-03-01 | Disposition: A | Discharge status: Home / Self Care | Payer: 59 | Source: Ambulatory Visit | Attending: Physician Assistant | Admitting: Physician Assistant

## 2020-03-01 DIAGNOSIS — Z1231 Encounter for screening mammogram for malignant neoplasm of breast: Secondary | ICD-10-CM | POA: Diagnosis not present

## 2020-03-02 ENCOUNTER — Telehealth: Payer: Self-pay

## 2020-03-02 NOTE — Telephone Encounter (Signed)
-----   Message from Mar Daring, PA-C sent at 03/02/2020  4:38 PM EDT ----- Normal mammogram. Repeat screening in one year.

## 2020-03-02 NOTE — Telephone Encounter (Signed)
Normal mammogram. Repeat screening in one year.  Written by Mar Daring, PA-C on 03/02/2020 4:38 PM EDT Seen by patient [name] on 03/02/2020 4:45 PM

## 2020-06-28 DIAGNOSIS — H524 Presbyopia: Secondary | ICD-10-CM | POA: Diagnosis not present

## 2020-08-22 DIAGNOSIS — L578 Other skin changes due to chronic exposure to nonionizing radiation: Secondary | ICD-10-CM | POA: Diagnosis not present

## 2020-08-22 DIAGNOSIS — Z85828 Personal history of other malignant neoplasm of skin: Secondary | ICD-10-CM | POA: Diagnosis not present

## 2020-08-22 DIAGNOSIS — Z86018 Personal history of other benign neoplasm: Secondary | ICD-10-CM | POA: Diagnosis not present

## 2020-11-13 ENCOUNTER — Other Ambulatory Visit: Payer: Self-pay

## 2020-11-13 MED FILL — Atorvastatin Calcium Tab 10 MG (Base Equivalent): ORAL | 90 days supply | Qty: 90 | Fill #0 | Status: AC

## 2020-11-13 MED FILL — Butalbital-Acetaminophen-Caffeine Tab 50-325-40 MG: ORAL | 30 days supply | Qty: 30 | Fill #0 | Status: AC

## 2020-11-26 ENCOUNTER — Other Ambulatory Visit: Payer: Self-pay

## 2020-11-26 ENCOUNTER — Telehealth: Payer: 59 | Admitting: Family

## 2020-11-26 ENCOUNTER — Ambulatory Visit: Payer: 59 | Admitting: Family Medicine

## 2020-11-26 ENCOUNTER — Ambulatory Visit: Payer: Self-pay

## 2020-11-26 DIAGNOSIS — J069 Acute upper respiratory infection, unspecified: Secondary | ICD-10-CM

## 2020-11-26 MED ORDER — DM-GUAIFENESIN ER 30-600 MG PO TB12
1.0000 | ORAL_TABLET | Freq: Two times a day (BID) | ORAL | 0 refills | Status: DC
  Filled 2020-11-26: qty 20, 10d supply, fill #0

## 2020-11-26 MED ORDER — BENZONATATE 100 MG PO CAPS
100.0000 mg | ORAL_CAPSULE | Freq: Three times a day (TID) | ORAL | 0 refills | Status: DC | PRN
  Filled 2020-11-26: qty 20, 7d supply, fill #0

## 2020-11-26 MED ORDER — FLUTICASONE PROPIONATE 50 MCG/ACT NA SUSP
2.0000 | Freq: Every day | NASAL | 6 refills | Status: DC
  Filled 2020-11-26: qty 16, 30d supply, fill #0

## 2020-11-26 NOTE — Progress Notes (Signed)
Telephone Visit    Virtual Visit via Video Note   This visit type was conducted due to national recommendations for restrictions regarding the COVID-19 Pandemic (e.g. social distancing) in an effort to limit this patient's exposure and mitigate transmission in our community. This patient is at least at moderate risk for complications without adequate follow up. This format is felt to be most appropriate for this patient at this time. Physical exam was limited by quality of the  audio technology used for the visit.   Patient location: home Provider location: office  I discussed the limitations of evaluation and management by telemedicine and the availability of in person appointments. The patient expressed understanding and agreed to proceed.  Patient: Lauren Sims   DOB: [birth year]   70 y.o. Female  MRN: [id number] Visit Date: 11/26/2020  Today's healthcare provider: Vernie Murders, PA-C   No chief complaint on file.  Subjective    HPI  Patient is a 70 year old female who presents via telephone visit for symptoms of cough and congestion.  She states her symptoms began 3 nights ago with sore throat, productive cough, voice change and congestion.  She denies fever but has had wheezing and some shortness of breath with exertion. She cares for her 90+ mother and is concerned about spreading something to her.  She has tested for Covid on 11/23/20 and today 11/26/20, both were negative.    Past Medical History:  Diagnosis Date  . Arthritis    hips/knees  . Complication of anesthesia    slow to wake  . Dental crown present    cap - top, front. crown - top,rear  . Headache   . Mitral valve prolapse    followed by PCP   Past Surgical History:  Procedure Laterality Date  . APPENDECTOMY    . COLONOSCOPY WITH PROPOFOL N/A 04/27/2015   Procedure: COLONOSCOPY WITH PROPOFOL;  Surgeon: Lucilla Lame, MD;  Location: [facility];  Service: Endoscopy;  Laterality: N/A;  . KNEE  SURGERY     Social History   Tobacco Use  . Smoking status: Never Smoker  . Smokeless tobacco: Never Used  Vaping Use  . Vaping Use: Never used  Substance Use Topics  . Alcohol use: Yes    Comment: 1 glass wine every other week  . Drug use: No   Family History  Problem Relation Age of Onset  . Glaucoma Mother   . Hearing loss Mother   . Heart disease Sister   . Cancer Sister        Breast cancer dx last year 2020  . Cancer Sister   . Breast cancer Sister    Allergies  Allergen Reactions  . Codeine Nausea And Vomiting  . Shellfish Allergy Rash     Medications: Outpatient Medications Prior to Visit  Medication Sig  . aspirin 325 MG tablet Take 325 mg by mouth daily as needed.  Marland Kitchen atorvastatin (LIPITOR) 10 MG tablet TAKE 1 TABLET BY MOUTH ONCE DAILY.  . butalbital-acetaminophen-caffeine (FIORICET) 50-325-40 MG tablet TAKE 1 TABLET BY MOUTH AS NEEDED.  Marland Kitchen escitalopram (LEXAPRO) 10 MG tablet Take 1 tablet (10 mg total) by mouth daily.  Marland Kitchen LYSINE PO Take 1 capsule by mouth every other day.   . Omega-3 Fatty Acids (FISH OIL) 1000 MG CAPS Take 1 capsule by mouth daily.   . Vitamin D, Ergocalciferol, (DRISDOL) 1.25 MG (50000 UNIT) CAPS capsule Take 1 capsule (50,000 Units total) by mouth every 7 (seven) days.  No facility-administered medications prior to visit.    Review of Systems  Constitutional: Positive for fatigue. Negative for chills, diaphoresis and fever.  HENT: Positive for congestion, sore throat and voice change. Negative for postnasal drip and rhinorrhea.   Respiratory: Positive for cough, chest tightness, shortness of breath and wheezing.       Objective    There were no vitals taken for this visit.   Physical Exam: No acute respiratory distress during telephonic interview.    Assessment & Plan     1. URI, acute Developed cough, congestion, sore throat and headache over the past 3 days. Negative COVID test on 11-23-20 and 11-26-20. She has had  vaccinations and one booster for COVID. I see she had talke to the [facility] and was prescribed Tessalon Perles with Flonase. Add Mucinex-DM for cough, gargle with warm saltwater and use Tylenol or Advil prn headache. Recheck prn. - dextromethorphan-guaiFENesin (MUCINEX DM) 30-600 MG 12hr tablet; Take 1 tablet by mouth 2 (two) times daily.  Dispense: 20 tablet; Refill: 0   No follow-ups on file.     I discussed the assessment and treatment plan with the patient. The patient was provided an opportunity to ask questions and all were answered. The patient agreed with the plan and demonstrated an understanding of the instructions.   The patient was advised to call back or seek an in-person evaluation if the symptoms worsen or if the condition fails to improve as anticipated.  I provided 20 minutes of non-face-to-face time during this encounter.  I, Dennis Chrismon, PA-C, have reviewed all documentation for this visit. The documentation on 11/26/20 for the exam, diagnosis, procedures, and orders are all accurate and complete.   Vernie Murders, PA-C [employer] [phone] (phone) [phone] (fax)  [place]

## 2020-11-26 NOTE — Progress Notes (Signed)

## 2020-11-26 NOTE — Telephone Encounter (Signed)
Pt. Started feeling bad Friday. Has cough, non-productive.sore throat,fatigue, wheezing. No fever. Will do a home COVID 19 test this morning. Virtual appointment made.  Reason for Disposition . [1] Continuous (nonstop) coughing interferes with work or school AND [2] no improvement using cough treatment per protocol  Answer Assessment - Initial Assessment Questions 1. ONSET: "When did the cough begin?"      Friday 2. SEVERITY: "How bad is the cough today?"      Moderate 3. SPUTUM: "Describe the color of your sputum" (none, dry cough; clear, white, yellow, green)     No 4. HEMOPTYSIS: "Are you coughing up any blood?" If so ask: "How much?" (flecks, streaks, tablespoons, etc.)     No 5. DIFFICULTY BREATHING: "Are you having difficulty breathing?" If Yes, ask: "How bad is it?" (e.g., mild, moderate, severe)    - MILD: No SOB at rest, mild SOB with walking, speaks normally in sentences, can lay down, no retractions, pulse < 100.    - MODERATE: SOB at rest, SOB with minimal exertion and prefers to sit, cannot lie down flat, speaks in phrases, mild retractions, audible wheezing, pulse 100-120.    - SEVERE: Very SOB at rest, speaks in single words, struggling to breathe, sitting hunched forward, retractions, pulse > 120      Mild 6. FEVER: "Do you have a fever?" If Yes, ask: "What is your temperature, how was it measured, and when did it start?"     No 7. CARDIAC HISTORY: "Do you have any history of heart disease?" (e.g., heart attack, congestive heart failure)      No 8. LUNG HISTORY: "Do you have any history of lung disease?"  (e.g., pulmonary embolus, asthma, emphysema)     No 9. PE RISK FACTORS: "Do you have a history of blood clots?" (or: recent major surgery, recent prolonged travel, bedridden)     No 10. OTHER SYMPTOMS: "Do you have any other symptoms?" (e.g., runny nose, wheezing, chest pain)       Sore throat, wheezing 11. PREGNANCY: "Is there any chance you are pregnant?" "When was  your last menstrual period?"       No 12. TRAVEL: "Have you traveled out of the country in the last month?" (e.g., travel history, exposures)       No  Protocols used: COUGH - ACUTE NON-PRODUCTIVE-A-AH

## 2021-01-04 ENCOUNTER — Ambulatory Visit (INDEPENDENT_AMBULATORY_CARE_PROVIDER_SITE_OTHER): Payer: 59 | Admitting: Family Medicine

## 2021-01-04 ENCOUNTER — Encounter: Payer: Self-pay | Admitting: Family Medicine

## 2021-01-04 ENCOUNTER — Other Ambulatory Visit: Payer: Self-pay

## 2021-01-04 VITALS — BP 108/78 | HR 80 | Ht 66.0 in | Wt 172.0 lb

## 2021-01-04 DIAGNOSIS — E559 Vitamin D deficiency, unspecified: Secondary | ICD-10-CM

## 2021-01-04 DIAGNOSIS — E7800 Pure hypercholesterolemia, unspecified: Secondary | ICD-10-CM

## 2021-01-04 DIAGNOSIS — Z1231 Encounter for screening mammogram for malignant neoplasm of breast: Secondary | ICD-10-CM

## 2021-01-04 DIAGNOSIS — E2839 Other primary ovarian failure: Secondary | ICD-10-CM | POA: Diagnosis not present

## 2021-01-04 NOTE — Progress Notes (Signed)
Complete physical exam   Patient: Lauren Sims   DOB: 1951/03/19   70 y.o. Female  MRN: [id number] Visit Date: 01/04/2021  Today's healthcare provider: Lelon Huh, MD   Chief Complaint  Patient presents with   Annual Exam   Subjective    Lauren Sims is a 70 y.o. female who presents today for a complete physical exam.  She reports consuming a general diet.  She generally feels well. She reports sleeping well. She does not have additional problems to discuss today.      Past Medical History:  Diagnosis Date   Arthritis    hips/knees   Complication of anesthesia    slow to wake   Dental crown present    cap - top, front. crown - top,rear   Headache    Mitral valve prolapse    followed by PCP   Past Surgical History:  Procedure Laterality Date   APPENDECTOMY     COLONOSCOPY WITH PROPOFOL N/A 04/27/2015   Procedure: COLONOSCOPY WITH PROPOFOL;  Surgeon: Lauren Lame, MD;  Location: [facility];  Service: Endoscopy;  Laterality: N/A;   KNEE SURGERY     Social History   Socioeconomic History   Marital status: Married    Spouse name: Not on file   Number of children: 2   Years of education: Not on file   Highest education level: Associate degree: occupational, Hotel manager, or vocational program  Occupational History   Not on file  Tobacco Use   Smoking status: Never   Smokeless tobacco: Never  Vaping Use   Vaping Use: Never used  Substance and Sexual Activity   Alcohol use: Yes    Comment: 1 glass wine every other week   Drug use: No   Sexual activity: Not on file  Other Topics Concern   Not on file  Social History Narrative   Not on file   Social Determinants of Health   Financial Resource Strain: Low Risk    Difficulty of Paying Living Expenses: Not hard at all  Food Insecurity: No Food Insecurity   Worried About Charity fundraiser in the Last Year: Never true   [place] in the Last Year: Never true  Transportation Needs: No  Transportation Needs   Lack of Transportation (Medical): No   Lack of Transportation (Non-Medical): No  Physical Activity: Insufficiently Active   Days of Exercise per Week: 3 days   Minutes of Exercise per Session: 20 min  Stress: No Stress Concern Present   Feeling of Stress : Only a little  Social Connections: Engineer, building services of Communication with Friends and Family: More than three times a week   Frequency of Social Gatherings with Friends and Family: More than three times a week   Attends Religious Services: More than 4 times per year   Active Member of [employer] or Organizations: Yes   Attends Music therapist: More than 4 times per year   Marital Status: Married  Human resources officer Violence: Not on file   Family Status  Relation Name Status   Mother  43, age 2y       Healthy   Father  Deceased at age 76       2013 heart failure   Sister  10, age 62y   Sister 35 Alive   Sister  Deceased at age 76       [place], age 33y  Daughter  Alive   Daughter  Alive   Neg Hx  (Not Specified)   Family History  Problem Relation Age of Onset   Glaucoma Mother    Hearing loss Mother    Heart disease Father    Heart disease Sister    Breast cancer Sister    Kidney cancer Sister    Prostate cancer Brother    Cancer - Colon Neg Hx    Allergies  Allergen Reactions   Codeine Nausea And Vomiting   Shellfish Allergy Rash    Patient Care Team: Rubye Beach as PCP - General (Family Medicine)   Medications: Outpatient Medications Prior to Visit  Medication Sig   aspirin 325 MG tablet Take 325 mg by mouth daily as needed.   atorvastatin (LIPITOR) 10 MG tablet TAKE 1 TABLET BY MOUTH ONCE DAILY.   butalbital-acetaminophen-caffeine (FIORICET) 50-325-40 MG tablet TAKE 1 TABLET BY MOUTH AS NEEDED.   Omega-3 Fatty Acids (FISH OIL) 1000 MG CAPS Take 1 capsule by mouth daily.    [DISCONTINUED] benzonatate (TESSALON  PERLES) 100 MG capsule Take 1 capsule (100 mg total) by mouth 3 (three) times daily as needed.   LYSINE PO Take 1 capsule by mouth every other day.    [DISCONTINUED] dextromethorphan-guaiFENesin (MUCINEX DM) 30-600 MG 12hr tablet Take 1 tablet by mouth 2 (two) times daily.   [DISCONTINUED] escitalopram (LEXAPRO) 10 MG tablet Take 1 tablet (10 mg total) by mouth daily.   [DISCONTINUED] fluticasone (FLONASE) 50 MCG/ACT nasal spray Place 2 sprays into both nostrils daily.   [DISCONTINUED] Vitamin D, Ergocalciferol, (DRISDOL) 1.25 MG (50000 UNIT) CAPS capsule Take 1 capsule (50,000 Units total) by mouth every 7 (seven) days.   No facility-administered medications prior to visit.    Review of Systems  Constitutional:  Negative for chills, diaphoresis, fever, malaise/fatigue and weight loss.  HENT:  Negative for congestion, ear discharge, ear pain, hearing loss, nosebleeds, sore throat and tinnitus.   Eyes:  Negative for blurred vision, double vision, photophobia, pain, discharge and redness.  Respiratory:  Negative for cough, hemoptysis, sputum production, shortness of breath, wheezing and stridor.   Cardiovascular:  Negative for chest pain, palpitations, orthopnea, claudication, leg swelling and PND.  Gastrointestinal:  Negative for abdominal pain, blood in stool, constipation, diarrhea, heartburn, melena, nausea and vomiting.  Genitourinary:  Negative for dysuria, flank pain, frequency, hematuria and urgency.  Musculoskeletal:  Negative for back pain, falls, joint pain, myalgias and neck pain.  Skin:  Negative for itching and rash.  Neurological:  Negative for dizziness, tingling, tremors, sensory change, speech change, focal weakness, seizures, loss of consciousness, weakness and headaches.  Endo/Heme/Allergies:  Negative for environmental allergies and polydipsia. Does not bruise/bleed easily.  Psychiatric/Behavioral:  Negative for depression, hallucinations, memory loss, substance abuse and  suicidal ideas. The patient is not nervous/anxious and does not have insomnia.       Objective    BP 108/78 (BP Location: Right Arm, Patient Position: Sitting, Cuff Size: Normal)   Pulse 80   Ht 5\' 6"  (1.676 m)   Wt 172 lb (78 kg)   SpO2 98%   BMI 27.76 kg/m    Physical Exam   General Appearance:     Overweight female. Alert, cooperative, in no acute distress, appears stated age   Head:    Normocephalic, without obvious abnormality, atraumatic  Eyes:    PERRL, conjunctiva/corneas clear, EOM's intact, fundi    benign, both eyes  Ears:    Normal TM's and external ear  canals, both ears  Nose:   Nares normal, septum midline, mucosa normal, no drainage    or sinus tenderness  Throat:   Lips, mucosa, and tongue normal; teeth and gums normal  Neck:   Supple, symmetrical, trachea midline, no adenopathy;    thyroid:  no enlargement/tenderness/nodules; no carotid   bruit or JVD  Back:     Symmetric, no curvature, ROM normal, no CVA tenderness  Lungs:     Clear to auscultation bilaterally, respirations unlabored  Chest Wall:    No tenderness or deformity   Heart:    Normal heart rate. Normal rhythm.  No murmurs.   Breast Exam:    deferred  Abdomen:     Soft, non-tender, bowel sounds active all four quadrants,    no masses, no organomegaly  Pelvic:    deferred  Extremities:   All extremities are intact. No cyanosis or edema  Pulses:   2+ and symmetric all extremities  Skin:   Skin color, texture, turgor normal, no rashes or lesions  Lymph nodes:   Cervical, supraclavicular, and axillary nodes normal  Neurologic:   CNII-XII intact, normal strength, sensation and reflexes    throughout     Last depression screening scores PHQ 2/9 Scores 01/04/2021 01/23/2020 01/21/2019  PHQ - 2 Score 0 0 0  PHQ- 9 Score 0 - 0   Last fall risk screening Fall Risk  01/04/2021  Falls in the past year? 0  Number falls in past yr: 0  Injury with Fall? 0  Risk for fall due to : No Fall Risks  Follow  up Falls evaluation completed   Last Audit-C alcohol use screening Alcohol Use Disorder Test (AUDIT) 01/04/2021  1. How often do you have a drink containing alcohol? 1  2. How many drinks containing alcohol do you have on a typical day when you are drinking? 0  3. How often do you have six or more drinks on one occasion? 0  AUDIT-C Score 1  Alcohol Brief Interventions/Follow-up -   A score of 3 or more in women, and 4 or more in men indicates increased risk for alcohol abuse, EXCEPT if all of the points are from question 1   No results found for any visits on 01/04/21.  Assessment & Plan    Routine Health Maintenance and Physical Exam  Exercise Activities and Dietary recommendations  Goals   None     Immunization History  Administered Date(s) Administered   Influenza, High Dose Seasonal PF 06/29/2017   Influenza,inj,Quad PF,6+ Mos 08/04/2018   PFIZER(Purple Top)SARS-COV-2 Vaccination 08/24/2019, 09/19/2019   Pneumococcal Conjugate-13 01/01/2018   Pneumococcal Polysaccharide-23 01/21/2019   Td 06/06/2003, 10/07/2007   Tdap 11/21/2009   Zoster, Live 02/04/2012    Health Maintenance  Topic Date Due   Zoster Vaccines- Shingrix (1 of 2) Never done   COVID-19 Vaccine (3 - Booster for Pfizer series) 02/16/2020   TETANUS/TDAP  01/22/2021 (Originally 11/22/2019)   DEXA SCAN  01/07/2021   INFLUENZA VACCINE  02/25/2021   MAMMOGRAM  03/01/2021   COLONOSCOPY (Pts 45-35yrs Insurance coverage will need to be confirmed)  04/26/2025   Hepatitis C Screening  Completed   PNA vac Low Risk Adult  Completed   HPV VACCINES  Aged Out    Discussed health benefits of physical activity, and encouraged her to engage in regular exercise appropriate for her age and condition.  1. Encounter for screening mammogram for malignant neoplasm of breast  - MM 3D SCREEN BREAST BILATERAL  2. Avitaminosis D She took weekly supplements x 12 last year, but has not taken any D supplements  - VITAMIN D 25  Hydroxy (Vit-D Deficiency, Fractures)  3. Hypercholesterolemia without hypertriglyceridemia She is tolerating atorvastatin well with no adverse effects.   - Comprehensive metabolic panel - Lipid panel  4. Estrogen deficiency  - DG Bone density [place]; Future    Counseled regarding Shingrix which she is not inclined to receive due to severe local reaction when she had Zostavax.   Advised she is due for tetanus vaccine.     The entirety of the information documented in the History of Present Illness, Review of Systems and Physical Exam were personally obtained by me. Portions of this information were initially documented by the CMA and reviewed by me for thoroughness and accuracy.     Lelon Huh, MD  [facility] [phone] (phone) [phone] (fax)  [place]

## 2021-01-04 NOTE — Patient Instructions (Signed)
I strongly recommend at least one booster dose (a third shot) of the Covid vaccine for all adults. People at high risk for serious Covid infections should have a second booster dose 4-6 months after the first booster.   You are due for a Tdap (tetanus-diptheria-pertussis vaccine) which protects you from tetanus and whooping cough. Please check with your insurance plan or pharmacy regarding coverage for this vaccine.

## 2021-01-08 DIAGNOSIS — E559 Vitamin D deficiency, unspecified: Secondary | ICD-10-CM | POA: Diagnosis not present

## 2021-01-08 DIAGNOSIS — E7800 Pure hypercholesterolemia, unspecified: Secondary | ICD-10-CM | POA: Diagnosis not present

## 2021-01-09 LAB — COMPREHENSIVE METABOLIC PANEL
ALT: 16 IU/L (ref 0–32)
AST: 15 IU/L (ref 0–40)
Albumin/Globulin Ratio: 1.7 (ref 1.2–2.2)
Albumin: 4.3 g/dL (ref 3.8–4.8)
Alkaline Phosphatase: 135 IU/L — ABNORMAL HIGH (ref 44–121)
BUN/Creatinine Ratio: 22 (ref 12–28)
BUN: 15 mg/dL (ref 8–27)
Bilirubin Total: 0.3 mg/dL (ref 0.0–1.2)
CO2: 23 mmol/L (ref 20–29)
Calcium: 9.5 mg/dL (ref 8.7–10.3)
Chloride: 103 mmol/L (ref 96–106)
Creatinine, Ser: 0.69 mg/dL (ref 0.57–1.00)
Globulin, Total: 2.6 g/dL (ref 1.5–4.5)
Glucose: 94 mg/dL (ref 65–99)
Potassium: 4.3 mmol/L (ref 3.5–5.2)
Sodium: 140 mmol/L (ref 134–144)
Total Protein: 6.9 g/dL (ref 6.0–8.5)
eGFR: 94 mL/min/{1.73_m2} (ref >59)

## 2021-01-09 LAB — LIPID PANEL
Chol/HDL Ratio: 3 ratio (ref 0.0–4.4)
Cholesterol, Total: 169 mg/dL (ref 100–199)
HDL: 56 mg/dL (ref >39)
LDL Chol Calc (NIH): 99 mg/dL (ref 0–99)
Triglycerides: 71 mg/dL (ref 0–149)
VLDL Cholesterol Cal: 14 mg/dL (ref 5–40)

## 2021-01-09 LAB — VITAMIN D 25 HYDROXY (VIT D DEFICIENCY, FRACTURES): Vit D, 25-Hydroxy: 24 ng/mL — ABNORMAL LOW (ref 30.0–100.0)

## 2021-01-24 ENCOUNTER — Encounter: Payer: Self-pay | Admitting: Physician Assistant

## 2021-03-05 ENCOUNTER — Ambulatory Visit
Admission: RE | Admit: 2021-03-05 | Discharge: 2021-03-05 | Disposition: A | Discharge status: Home / Self Care | Payer: 59 | Source: Ambulatory Visit | Attending: Family Medicine | Admitting: Family Medicine

## 2021-03-05 ENCOUNTER — Other Ambulatory Visit: Payer: Self-pay

## 2021-03-05 DIAGNOSIS — M8589 Other specified disorders of bone density and structure, multiple sites: Secondary | ICD-10-CM | POA: Diagnosis not present

## 2021-03-05 DIAGNOSIS — E2839 Other primary ovarian failure: Secondary | ICD-10-CM | POA: Diagnosis not present

## 2021-03-05 DIAGNOSIS — Z1231 Encounter for screening mammogram for malignant neoplasm of breast: Secondary | ICD-10-CM | POA: Insufficient documentation

## 2021-05-06 ENCOUNTER — Other Ambulatory Visit: Payer: Self-pay

## 2021-05-06 ENCOUNTER — Other Ambulatory Visit: Payer: Self-pay | Admitting: Physician Assistant

## 2021-05-06 DIAGNOSIS — G43709 Chronic migraine without aura, not intractable, without status migrainosus: Secondary | ICD-10-CM

## 2021-05-07 ENCOUNTER — Other Ambulatory Visit: Payer: Self-pay

## 2021-05-07 ENCOUNTER — Other Ambulatory Visit: Payer: Self-pay | Admitting: Family Medicine

## 2021-05-07 ENCOUNTER — Other Ambulatory Visit: Payer: Self-pay | Admitting: Physician Assistant

## 2021-05-07 DIAGNOSIS — E7800 Pure hypercholesterolemia, unspecified: Secondary | ICD-10-CM

## 2021-05-07 DIAGNOSIS — G43709 Chronic migraine without aura, not intractable, without status migrainosus: Secondary | ICD-10-CM

## 2021-05-07 MED ORDER — ATORVASTATIN CALCIUM 10 MG PO TABS
ORAL_TABLET | Freq: Every day | ORAL | 1 refills | Status: AC
  Filled 2021-05-07: qty 90, 90d supply, fill #0
  Filled 2021-12-04: qty 90, 90d supply, fill #1

## 2021-05-07 MED ORDER — BUTALBITAL-APAP-CAFFEINE 50-325-40 MG PO TABS
1.0000 | ORAL_TABLET | ORAL | 1 refills | Status: AC | PRN
  Filled 2021-05-07: qty 30, 30d supply, fill #0

## 2021-05-07 NOTE — Telephone Encounter (Signed)
Last office visit: 01/04/2021  Last refill for Atorvastatin: 01/25/2020 #90 with 3 refills (prescription has expired  Fioricet is not listed on active medication list.

## 2021-05-08 ENCOUNTER — Other Ambulatory Visit: Payer: Self-pay

## 2021-05-08 NOTE — Telephone Encounter (Signed)
Medication was sent into pharmacy yesterday. I called pharmacy and confirmed receipt.

## 2021-05-14 ENCOUNTER — Ambulatory Visit: Payer: Self-pay | Admitting: *Deleted

## 2021-05-14 NOTE — Telephone Encounter (Signed)
Pt called in because she tested positive for Covid yesterday.   She started feeling bad on Friday like she was coming down with a cold, sore throat, stuffy nose, post nasal drip, fatigue, mild body aches and feeling hot and cold.   She also mentioned she has been coughing a lot so her chest feels slightly tight and it hurts when she coughs.   She denies being short of breath but not having much energy so she feels tired easily.   Denies having shortness of breath with sitting.    "I think it's from feeling so tired".   "I don't feel short of breath just tired when I walk from one end of the house to the other".   No cardiac or lung history.  She has decided she does not want to do an antiviral at this point since she is healthy and not having severe symptoms.   I instructed her to call us back if the chest tightness did not resolve or got worse or she developed shortness of breath or started coughing up mucus right now it's a dry cough.   She verbalized understanding and wanted to see how she does and let it run its course but will call us back if her symptoms got worse or she wasn't getting better after a couple of days.   I went over the care advice with her.  The quarantine, etc.   She is isolating from her husband.   He is not having symptoms.  I sent my notes to [facility].

## 2021-05-14 NOTE — Telephone Encounter (Signed)
Reason for Disposition  [1] COVID-19 diagnosed by positive lab test (e.g., PCR, rapid self-test kit) AND [2] mild symptoms (e.g., cough, fever, others) AND [7] no complications or SOB  Answer Assessment - Initial Assessment Questions 1. COVID-19 DIAGNOSIS: "Who made your COVID-19 diagnosis?" "Was it confirmed by a positive lab test or self-test?" If not diagnosed by a doctor (or NP/PA), ask "Are there lots of cases (community spread) where you live?" Note: See public health department website, if unsure.     Pt calling in.   She is positive for Covid and having tightness in her chest.  It began over the weekend.   I tested Fri and it was negative because I felt bad Fri.   Sun. I felt worse.   I tested yesterday and it was positive.   I'm having cold and flu symptoms.   I coughing and using cough drops.   I didn't know what they are doing for it now. 2. COVID-19 EXPOSURE: "Was there any known exposure to COVID before the symptoms began?" CDC Definition of close contact: within 6 feet (2 meters) for a total of 15 minutes or more over a 24-hour period.      I work part time at [employer].   But I'm not exposed to anyone positive I know of. 3. ONSET: "When did the COVID-19 symptoms start?"      Friday I started like I was getting a cold.   My nose was stuffy and feeling tired.   My husband works at the hospital.    4. WORST SYMPTOM: "What is your worst symptom?" (e.g., cough, fever, shortness of breath, muscle aches)     Sore throat  I'm having drainage down the back of my throat.   I wondered sinus infection was starting.   When weather changes I get a cold. 5. COUGH: "Do you have a cough?" If Yes, ask: "How bad is the cough?"       Coughing it's dry. 6. FEVER: "Do you have a fever?" If Yes, ask: "What is your temperature, how was it measured, and when did it start?"     No fever  7. RESPIRATORY STATUS: "Describe your breathing?" (e.g., shortness of breath, wheezing, unable to speak)      Having chest  tightness.    I get tired walking from one end of the house to the other.   I'm exhausted.   I'm not out of breath but no energy. 8. BETTER-SAME-WORSE: "Are you getting better, staying the same or getting worse compared to yesterday?"  If getting worse, ask, "In what way?"     I feel about the same.   9. HIGH RISK DISEASE: "Do you have any chronic medical problems?" (e.g., asthma, heart or lung disease, weak immune system, obesity, etc.)     I'm healthy.   10. VACCINE: "Have you had the COVID-19 vaccine?" If Yes, ask: "Which one, how many shots, when did you get it?"       I've had 2 11. BOOSTER: "Have you received your COVID-19 booster?" If Yes, ask: "Which one and when did you get it?"       1 booster. 12. PREGNANCY: "Is there any chance you are pregnant?" "When was your last menstrual period?"       N/A 13. OTHER SYMPTOMS: "Do you have any other symptoms?"  (e.g., chills, fatigue, headache, loss of smell or taste, muscle pain, sore throat)       Body aches a little, hot  and cold, fatigue, headaches, no change in taste or smell,  no diarrhea or vomiting.   Chest tightness across the front of my chest and when I cough.   A deep breath is tight but not painful.   No cardiac history.  14. O2 SATURATION MONITOR:  "Do you use an oxygen saturation monitor (pulse oximeter) at home?" If Yes, ask "What is your reading (oxygen level) today?" "What is your usual oxygen saturation reading?" (e.g., 95%)       No  Protocols used: Coronavirus (COVID-19) Diagnosed or Suspected-A-AH

## 2021-08-21 DIAGNOSIS — Z85828 Personal history of other malignant neoplasm of skin: Secondary | ICD-10-CM | POA: Diagnosis not present

## 2021-08-21 DIAGNOSIS — D485 Neoplasm of uncertain behavior of skin: Secondary | ICD-10-CM | POA: Diagnosis not present

## 2021-08-21 DIAGNOSIS — L578 Other skin changes due to chronic exposure to nonionizing radiation: Secondary | ICD-10-CM | POA: Diagnosis not present

## 2021-08-21 DIAGNOSIS — Z86018 Personal history of other benign neoplasm: Secondary | ICD-10-CM | POA: Diagnosis not present

## 2021-08-21 DIAGNOSIS — D2271 Melanocytic nevi of right lower limb, including hip: Secondary | ICD-10-CM | POA: Diagnosis not present

## 2021-09-26 DIAGNOSIS — D235 Other benign neoplasm of skin of trunk: Secondary | ICD-10-CM | POA: Diagnosis not present

## 2021-09-26 DIAGNOSIS — L988 Other specified disorders of the skin and subcutaneous tissue: Secondary | ICD-10-CM | POA: Diagnosis not present

## 2021-12-04 ENCOUNTER — Other Ambulatory Visit: Payer: Self-pay

## 2022-02-19 ENCOUNTER — Other Ambulatory Visit: Payer: Self-pay

## 2022-02-19 ENCOUNTER — Other Ambulatory Visit: Payer: Self-pay | Admitting: Family Medicine

## 2022-02-19 DIAGNOSIS — G43709 Chronic migraine without aura, not intractable, without status migrainosus: Secondary | ICD-10-CM

## 2022-02-20 ENCOUNTER — Other Ambulatory Visit: Payer: Self-pay

## 2022-02-20 ENCOUNTER — Other Ambulatory Visit: Payer: Self-pay | Admitting: Family Medicine

## 2022-02-20 DIAGNOSIS — G43709 Chronic migraine without aura, not intractable, without status migrainosus: Secondary | ICD-10-CM

## 2022-02-20 NOTE — Telephone Encounter (Signed)
Requested medication (s) are due for refill today:   Provider to review  Requested medication (s) are on the active medication list:   Yes  Future visit scheduled:   No   Last ordered: 05/07/2021 #30, 1 refill  Returned because non delegated refill    Requested Prescriptions  Pending Prescriptions Disp Refills   butalbital-acetaminophen-caffeine (FIORICET) 50-325-40 MG tablet 30 tablet 1    Sig: TAKE 1 TABLET BY MOUTH AS NEEDED.     Not Delegated - Analgesics:  Non-Opioid Analgesic Combinations 2 Failed - 02/19/2022  5:01 PM      Failed - This refill cannot be delegated      Failed - Cr in normal range and within 360 days    Creatinine, Ser  Date Value Ref Range Status  01/08/2021 0.69 0.57 - 1.00 mg/dL Final         Failed - eGFR is 10 or above and within 360 days    GFR calc Af Amer  Date Value Ref Range Status  01/23/2020 107 >59 mL/min/1.73 Final    Comment:    **[facility] currently reports eGFR in compliance with the current**   recommendations of the [employer]. [facility] will   update reporting as new guidelines are published from the NKF-ASN   Task force.    GFR calc non Af Amer  Date Value Ref Range Status  01/23/2020 93 >59 mL/min/1.73 Final   eGFR  Date Value Ref Range Status  01/08/2021 94 >59 mL/min/1.73 Final         Failed - Valid encounter within last 12 months    Recent Outpatient Visits           1 year ago Encounter for screening mammogram for malignant neoplasm of breast   [facility] Birdie Sons, MD   1 year ago URI, acute   [place], Vickki Muff, PA-C   2 years ago Annual physical exam   [place], [place]   2 years ago Nonintractable headache, unspecified chronicity pattern, unspecified headache type   [place], Vickki Muff, PA-C   3 years ago Annual physical exam   [facility] Fenton Malling M, [place]               Passed - Patient is not pregnant

## 2022-02-21 ENCOUNTER — Other Ambulatory Visit: Payer: Self-pay

## 2022-03-07 DIAGNOSIS — E538 Deficiency of other specified B group vitamins: Secondary | ICD-10-CM | POA: Diagnosis not present

## 2022-03-07 DIAGNOSIS — E663 Overweight: Secondary | ICD-10-CM | POA: Diagnosis not present

## 2022-03-07 DIAGNOSIS — E785 Hyperlipidemia, unspecified: Secondary | ICD-10-CM | POA: Diagnosis not present

## 2022-04-21 DIAGNOSIS — L578 Other skin changes due to chronic exposure to nonionizing radiation: Secondary | ICD-10-CM | POA: Diagnosis not present

## 2022-04-21 DIAGNOSIS — Z86018 Personal history of other benign neoplasm: Secondary | ICD-10-CM | POA: Diagnosis not present

## 2022-04-21 DIAGNOSIS — Z85828 Personal history of other malignant neoplasm of skin: Secondary | ICD-10-CM | POA: Diagnosis not present

## 2022-07-11 ENCOUNTER — Other Ambulatory Visit: Payer: Self-pay | Admitting: Family Medicine

## 2022-07-11 DIAGNOSIS — R7309 Other abnormal glucose: Secondary | ICD-10-CM | POA: Diagnosis not present

## 2022-07-11 DIAGNOSIS — Z7689 Persons encountering health services in other specified circumstances: Secondary | ICD-10-CM | POA: Diagnosis not present

## 2022-07-11 DIAGNOSIS — E559 Vitamin D deficiency, unspecified: Secondary | ICD-10-CM | POA: Diagnosis not present

## 2022-07-11 DIAGNOSIS — Z1231 Encounter for screening mammogram for malignant neoplasm of breast: Secondary | ICD-10-CM | POA: Diagnosis not present

## 2022-07-11 DIAGNOSIS — R011 Cardiac murmur, unspecified: Secondary | ICD-10-CM | POA: Diagnosis not present

## 2022-07-11 DIAGNOSIS — Z0000 Encounter for general adult medical examination without abnormal findings: Secondary | ICD-10-CM | POA: Diagnosis not present

## 2022-07-11 DIAGNOSIS — G43809 Other migraine, not intractable, without status migrainosus: Secondary | ICD-10-CM | POA: Diagnosis not present

## 2022-07-11 DIAGNOSIS — J3089 Other allergic rhinitis: Secondary | ICD-10-CM | POA: Diagnosis not present

## 2022-07-11 DIAGNOSIS — E7800 Pure hypercholesterolemia, unspecified: Secondary | ICD-10-CM | POA: Diagnosis not present

## 2022-07-11 DIAGNOSIS — M8580 Other specified disorders of bone density and structure, unspecified site: Secondary | ICD-10-CM | POA: Diagnosis not present

## 2022-07-11 DIAGNOSIS — Z803 Family history of malignant neoplasm of breast: Secondary | ICD-10-CM | POA: Diagnosis not present

## 2022-07-17 ENCOUNTER — Other Ambulatory Visit: Payer: Self-pay

## 2022-07-17 MED ORDER — ATORVASTATIN CALCIUM 10 MG PO TABS
ORAL_TABLET | ORAL | 2 refills | Status: AC
  Filled 2022-07-17: qty 30, 30d supply, fill #0

## 2022-07-17 MED ORDER — ATORVASTATIN CALCIUM 10 MG PO TABS
10.0000 mg | ORAL_TABLET | Freq: Every day | ORAL | 2 refills | Status: AC
  Filled 2022-07-17: qty 30, 30d supply, fill #0

## 2022-07-23 ENCOUNTER — Ambulatory Visit
Admission: RE | Admit: 2022-07-23 | Discharge: 2022-07-23 | Disposition: A | Discharge status: Home / Self Care | Payer: PPO | Source: Ambulatory Visit | Attending: Family Medicine | Admitting: Family Medicine

## 2022-07-23 DIAGNOSIS — Z1231 Encounter for screening mammogram for malignant neoplasm of breast: Secondary | ICD-10-CM | POA: Diagnosis not present

## 2022-09-10 ENCOUNTER — Other Ambulatory Visit: Payer: Self-pay

## 2022-09-10 MED ORDER — IBUPROFEN 800 MG PO TABS
800.0000 mg | ORAL_TABLET | Freq: Three times a day (TID) | ORAL | 0 refills | Status: AC | PRN
  Filled 2022-09-10: qty 30, 10d supply, fill #0

## 2022-09-10 MED ORDER — AMOXICILLIN 500 MG PO CAPS
500.0000 mg | ORAL_CAPSULE | Freq: Three times a day (TID) | ORAL | 0 refills | Status: AC
  Filled 2022-09-10: qty 21, 7d supply, fill #0

## 2022-09-15 DIAGNOSIS — H2511 Age-related nuclear cataract, right eye: Secondary | ICD-10-CM | POA: Diagnosis not present

## 2022-09-15 DIAGNOSIS — H2512 Age-related nuclear cataract, left eye: Secondary | ICD-10-CM | POA: Diagnosis not present

## 2022-09-15 DIAGNOSIS — H5213 Myopia, bilateral: Secondary | ICD-10-CM | POA: Diagnosis not present

## 2022-09-15 DIAGNOSIS — H43813 Vitreous degeneration, bilateral: Secondary | ICD-10-CM | POA: Diagnosis not present

## 2022-10-27 ENCOUNTER — Other Ambulatory Visit: Payer: Self-pay

## 2022-10-27 MED ORDER — ATORVASTATIN CALCIUM 10 MG PO TABS
10.0000 mg | ORAL_TABLET | Freq: Every day | ORAL | 2 refills | Status: AC
  Filled 2022-10-27 – 2022-12-23 (×2): qty 30, 30d supply, fill #0
  Filled 2023-02-03: qty 30, 30d supply, fill #1
  Filled 2023-08-24: qty 30, 30d supply, fill #2

## 2022-11-14 ENCOUNTER — Other Ambulatory Visit: Payer: Self-pay

## 2022-12-23 ENCOUNTER — Other Ambulatory Visit: Payer: Self-pay

## 2023-02-03 ENCOUNTER — Other Ambulatory Visit: Payer: Self-pay

## 2023-03-12 ENCOUNTER — Other Ambulatory Visit: Payer: Self-pay

## 2023-03-12 DIAGNOSIS — R350 Frequency of micturition: Secondary | ICD-10-CM | POA: Diagnosis not present

## 2023-03-12 DIAGNOSIS — N390 Urinary tract infection, site not specified: Secondary | ICD-10-CM | POA: Diagnosis not present

## 2023-03-12 MED ORDER — CEFDINIR 300 MG PO CAPS
300.0000 mg | ORAL_CAPSULE | Freq: Two times a day (BID) | ORAL | 0 refills | Status: AC
  Filled 2023-03-12: qty 14, 7d supply, fill #0

## 2023-06-28 DIAGNOSIS — M7061 Trochanteric bursitis, right hip: Secondary | ICD-10-CM | POA: Diagnosis not present

## 2023-07-10 ENCOUNTER — Other Ambulatory Visit: Payer: Self-pay

## 2023-07-10 DIAGNOSIS — M7061 Trochanteric bursitis, right hip: Secondary | ICD-10-CM | POA: Diagnosis not present

## 2023-07-10 MED ORDER — CELECOXIB 200 MG PO CAPS
200.0000 mg | ORAL_CAPSULE | Freq: Every day | ORAL | 0 refills | Status: DC
  Filled 2023-07-10 (×2): qty 60, 60d supply, fill #0

## 2023-07-17 ENCOUNTER — Other Ambulatory Visit: Payer: Self-pay | Admitting: Family Medicine

## 2023-07-17 DIAGNOSIS — Z1231 Encounter for screening mammogram for malignant neoplasm of breast: Secondary | ICD-10-CM

## 2023-07-24 DIAGNOSIS — M25551 Pain in right hip: Secondary | ICD-10-CM | POA: Diagnosis not present

## 2023-08-03 ENCOUNTER — Ambulatory Visit
Admission: RE | Admit: 2023-08-03 | Discharge: 2023-08-03 | Disposition: A | Discharge status: Home / Self Care | Payer: Medicare Other | Source: Ambulatory Visit | Attending: Family Medicine | Admitting: Family Medicine

## 2023-08-03 DIAGNOSIS — Z1231 Encounter for screening mammogram for malignant neoplasm of breast: Secondary | ICD-10-CM | POA: Insufficient documentation

## 2023-08-07 ENCOUNTER — Other Ambulatory Visit: Payer: Self-pay

## 2023-08-07 MED ORDER — TRIAMCINOLONE ACETONIDE 0.1 % EX CREA
TOPICAL_CREAM | Freq: Two times a day (BID) | CUTANEOUS | 0 refills | Status: AC
  Filled 2023-08-07: qty 30, 15d supply, fill #0

## 2023-08-24 ENCOUNTER — Other Ambulatory Visit: Payer: Self-pay

## 2023-08-24 MED ORDER — CELECOXIB 200 MG PO CAPS
200.0000 mg | ORAL_CAPSULE | Freq: Every day | ORAL | 0 refills | Status: AC
  Filled 2023-08-24: qty 60, 60d supply, fill #0

## 2023-08-25 ENCOUNTER — Other Ambulatory Visit: Payer: Self-pay

## 2024-02-23 ENCOUNTER — Other Ambulatory Visit: Payer: Self-pay

## 2024-02-23 MED ORDER — PREDNISONE 10 MG PO TABS
ORAL_TABLET | ORAL | 0 refills | Status: DC
  Filled 2024-02-23: qty 20, 8d supply, fill #0

## 2024-02-23 MED ORDER — AMOXICILLIN-POT CLAVULANATE 875-125 MG PO TABS
1.0000 | ORAL_TABLET | Freq: Two times a day (BID) | ORAL | 0 refills | Status: AC
  Filled 2024-02-23: qty 20, 10d supply, fill #0

## 2024-02-23 MED ORDER — BENZONATATE 200 MG PO CAPS
200.0000 mg | ORAL_CAPSULE | Freq: Three times a day (TID) | ORAL | 0 refills | Status: DC | PRN
  Filled 2024-02-23: qty 30, 10d supply, fill #0

## 2024-06-02 ENCOUNTER — Other Ambulatory Visit: Payer: Self-pay

## 2024-06-02 MED ORDER — METHYLPREDNISOLONE 4 MG PO TBPK
ORAL_TABLET | ORAL | 0 refills | Status: AC
  Filled 2024-06-02: qty 21, 6d supply, fill #0

## 2024-07-04 ENCOUNTER — Other Ambulatory Visit: Payer: Self-pay | Admitting: Family Medicine

## 2024-07-04 DIAGNOSIS — Z1231 Encounter for screening mammogram for malignant neoplasm of breast: Secondary | ICD-10-CM

## 2024-08-03 ENCOUNTER — Ambulatory Visit
Admission: RE | Admit: 2024-08-03 | Discharge: 2024-08-03 | Disposition: A | Discharge status: Home / Self Care | Source: Ambulatory Visit | Attending: Family Medicine | Admitting: Family Medicine

## 2024-08-03 DIAGNOSIS — Z1231 Encounter for screening mammogram for malignant neoplasm of breast: Secondary | ICD-10-CM | POA: Diagnosis present
